# Patient Record
Sex: Male | Born: 1995 | Race: White | Hispanic: No | Marital: Single | Smoking: Never smoker
Health system: Southern US, Community
[De-identification: ages and names within clinical notes are randomized; demographics above are authoritative.]

## PROBLEM LIST (undated history)

## (undated) DIAGNOSIS — N049 Nephrotic syndrome with unspecified morphologic changes: Secondary | ICD-10-CM

---
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
## 2014-01-03 ENCOUNTER — Ambulatory Visit: Payer: Self-pay

## 2014-01-03 LAB — CBC WITH DIFFERENTIAL/PLATELET
Basophil #: 0.1 10*3/uL (ref 0.0–0.1)
Basophil %: 1.3 %
EOS PCT: 4.4 %
Eosinophil #: 0.3 10*3/uL (ref 0.0–0.7)
HCT: 44.1 % (ref 40.0–52.0)
HGB: 14.5 g/dL (ref 13.0–18.0)
Lymphocyte #: 1.1 10*3/uL (ref 1.0–3.6)
Lymphocyte %: 15.5 %
MCH: 31.7 pg (ref 26.0–34.0)
MCHC: 32.9 g/dL (ref 32.0–36.0)
MCV: 96 fL (ref 80–100)
MONOS PCT: 16.8 %
Monocyte #: 1.2 x10 3/mm — ABNORMAL HIGH (ref 0.2–1.0)
Neutrophil #: 4.5 10*3/uL (ref 1.4–6.5)
Neutrophil %: 62 %
Platelet: 359 10*3/uL (ref 150–440)
RBC: 4.59 10*6/uL (ref 4.40–5.90)
RDW: 14 % (ref 11.5–14.5)
WBC: 7.3 10*3/uL (ref 3.8–10.6)

## 2014-01-03 LAB — RENAL FUNCTION PANEL
ALBUMIN: 3.9 g/dL (ref 3.8–5.6)
Anion Gap: 7 (ref 7–16)
BUN: 6 mg/dL — AB (ref 9–21)
CO2: 28 mmol/L — AB (ref 16–25)
CREATININE: 0.83 mg/dL (ref 0.60–1.30)
Calcium, Total: 9.2 mg/dL (ref 9.0–10.7)
Chloride: 105 mmol/L (ref 97–107)
EGFR (African American): >60
Glucose: 95 mg/dL (ref 65–99)
OSMOLALITY: 277 (ref 275–301)
Phosphorus: 3.5 mg/dL (ref 3.1–5.1)
Potassium: 5 mmol/L — ABNORMAL HIGH (ref 3.3–4.7)
Sodium: 140 mmol/L (ref 132–141)

## 2014-01-03 LAB — PROTEIN / CREATININE RATIO, URINE: Creatinine, Urine: 106 mg/dL (ref 30.0–125.0)

## 2016-11-19 ENCOUNTER — Encounter: Payer: Self-pay | Admitting: Emergency Medicine

## 2016-11-19 ENCOUNTER — Emergency Department: Payer: BLUE CROSS/BLUE SHIELD

## 2016-11-19 ENCOUNTER — Emergency Department
Admission: EM | Admit: 2016-11-19 | Discharge: 2016-11-19 | Disposition: A | Discharge status: Home / Self Care | Payer: BLUE CROSS/BLUE SHIELD | Attending: Emergency Medicine | Admitting: Emergency Medicine

## 2016-11-19 DIAGNOSIS — Y929 Unspecified place or not applicable: Secondary | ICD-10-CM | POA: Insufficient documentation

## 2016-11-19 DIAGNOSIS — S0181XA Laceration without foreign body of other part of head, initial encounter: Secondary | ICD-10-CM

## 2016-11-19 DIAGNOSIS — Y939 Activity, unspecified: Secondary | ICD-10-CM | POA: Diagnosis not present

## 2016-11-19 DIAGNOSIS — W208XXA Other cause of strike by thrown, projected or falling object, initial encounter: Secondary | ICD-10-CM | POA: Diagnosis not present

## 2016-11-19 DIAGNOSIS — F17290 Nicotine dependence, other tobacco product, uncomplicated: Secondary | ICD-10-CM | POA: Diagnosis not present

## 2016-11-19 DIAGNOSIS — Y999 Unspecified external cause status: Secondary | ICD-10-CM | POA: Diagnosis not present

## 2016-11-19 DIAGNOSIS — S0990XA Unspecified injury of head, initial encounter: Secondary | ICD-10-CM | POA: Diagnosis present

## 2016-11-19 DIAGNOSIS — S01111A Laceration without foreign body of right eyelid and periocular area, initial encounter: Secondary | ICD-10-CM | POA: Diagnosis not present

## 2016-11-19 HISTORY — DX: Nephrotic syndrome with unspecified morphologic changes: N04.9

## 2016-11-19 MED ORDER — LIDOCAINE HCL (PF) 1 % IJ SOLN
INTRAMUSCULAR | Status: AC
  Administered 2016-11-19: 5 mL
  Filled 2016-11-19: qty 5
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
## 2016-11-19 NOTE — ED Triage Notes (Signed)
Pt ambulatory to triage in NAD, report hit head, laceration to left brow, edges not approximated, active bleeding at this time, wound dressed in triage.

## 2016-11-19 NOTE — ED Notes (Signed)
Pt. Verbalizes understanding of d/c instructions and follow-up. VS stable and pain controlled per pt.  Pt. In NAD at time of d/c and denies further concerns regarding this visit. Pt. Stable at the time of departure from the unit, departing unit by the safest and most appropriate manner per that pt condition and limitations. Pt advised to return to the ED at any time for emergent concerns, or for new/worsening symptoms.   

## 2016-11-19 NOTE — ED Notes (Signed)
Pt transported to CT at this time.

## 2017-04-25 ENCOUNTER — Other Ambulatory Visit: Payer: Self-pay | Admitting: Physician Assistant

## 2017-04-25 DIAGNOSIS — N63 Unspecified lump in unspecified breast: Secondary | ICD-10-CM

## 2017-05-02 ENCOUNTER — Other Ambulatory Visit: Payer: BLUE CROSS/BLUE SHIELD

## 2021-03-31 ENCOUNTER — Other Ambulatory Visit: Payer: Self-pay | Admitting: Nephrology

## 2021-03-31 DIAGNOSIS — R809 Proteinuria, unspecified: Secondary | ICD-10-CM

## 2021-03-31 DIAGNOSIS — R8290 Unspecified abnormal findings in urine: Secondary | ICD-10-CM

## 2021-03-31 DIAGNOSIS — I10 Essential (primary) hypertension: Secondary | ICD-10-CM

## 2021-04-12 ENCOUNTER — Other Ambulatory Visit: Payer: Self-pay | Admitting: Nephrology

## 2021-04-12 DIAGNOSIS — N049 Nephrotic syndrome with unspecified morphologic changes: Secondary | ICD-10-CM

## 2021-04-12 DIAGNOSIS — R768 Other specified abnormal immunological findings in serum: Secondary | ICD-10-CM

## 2021-04-16 ENCOUNTER — Other Ambulatory Visit: Payer: Self-pay | Admitting: Physician Assistant

## 2021-04-16 NOTE — Progress Notes (Signed)
Patient for Renal biopsy 04/19/2021, called and spoke with patient's mother with pre procedure instructions given. Made aware to be here @ 0830, NPO after Mn prior to procedure, and driver post procedure/recovery/discharge. Stated understanding.

## 2021-04-19 ENCOUNTER — Ambulatory Visit
Admission: RE | Admit: 2021-04-19 | Discharge: 2021-04-19 | Disposition: A | Discharge status: Home / Self Care | Payer: 59 | Source: Ambulatory Visit | Attending: Nephrology | Admitting: Nephrology

## 2021-04-19 DIAGNOSIS — I10 Essential (primary) hypertension: Secondary | ICD-10-CM | POA: Insufficient documentation

## 2021-04-19 DIAGNOSIS — N049 Nephrotic syndrome with unspecified morphologic changes: Secondary | ICD-10-CM | POA: Diagnosis present

## 2021-04-19 DIAGNOSIS — I129 Hypertensive chronic kidney disease with stage 1 through stage 4 chronic kidney disease, or unspecified chronic kidney disease: Secondary | ICD-10-CM | POA: Insufficient documentation

## 2021-04-19 DIAGNOSIS — R768 Other specified abnormal immunological findings in serum: Secondary | ICD-10-CM | POA: Insufficient documentation

## 2021-04-19 DIAGNOSIS — Z8616 Personal history of COVID-19: Secondary | ICD-10-CM | POA: Diagnosis not present

## 2021-04-19 DIAGNOSIS — N189 Chronic kidney disease, unspecified: Secondary | ICD-10-CM | POA: Diagnosis not present

## 2021-04-19 LAB — PROTIME-INR
INR: 1 (ref 0.8–1.2)
Prothrombin Time: 12.9 seconds (ref 11.4–15.2)

## 2021-04-19 LAB — CBC
HCT: 48.1 % (ref 39.0–52.0)
Hemoglobin: 16.4 g/dL (ref 13.0–17.0)
MCH: 32.1 pg (ref 26.0–34.0)
MCHC: 34.1 g/dL (ref 30.0–36.0)
MCV: 94.1 fL (ref 80.0–100.0)
Platelets: 341 10*3/uL (ref 150–400)
RBC: 5.11 MIL/uL (ref 4.22–5.81)
RDW: 12.3 % (ref 11.5–15.5)
WBC: 14 10*3/uL — ABNORMAL HIGH (ref 4.0–10.5)
nRBC: 0 % (ref 0.0–0.2)

## 2021-04-19 MED ORDER — FENTANYL CITRATE (PF) 100 MCG/2ML IJ SOLN
INTRAMUSCULAR | Status: DC | PRN
  Administered 2021-04-19 (×2): 50 ug via INTRAVENOUS

## 2021-04-19 MED ORDER — SODIUM CHLORIDE 0.9 % IV SOLN: INTRAVENOUS | Status: DC

## 2021-04-19 MED ORDER — FENTANYL CITRATE (PF) 100 MCG/2ML IJ SOLN
INTRAMUSCULAR | Status: AC
  Filled 2021-04-19: qty 4

## 2021-04-19 MED ORDER — LABETALOL HCL 5 MG/ML IV SOLN
INTRAVENOUS | Status: AC
  Filled 2021-04-19: qty 4

## 2021-04-19 MED ORDER — MIDAZOLAM HCL 5 MG/5ML IJ SOLN
INTRAMUSCULAR | Status: AC
  Filled 2021-04-19: qty 5

## 2021-04-19 MED ORDER — MIDAZOLAM HCL 2 MG/2ML IJ SOLN
INTRAMUSCULAR | Status: DC | PRN
  Administered 2021-04-19 (×2): 1 mg via INTRAVENOUS

## 2021-04-19 MED ORDER — HYDRALAZINE HCL 20 MG/ML IJ SOLN
INTRAMUSCULAR | Status: AC
  Filled 2021-04-19: qty 1

## 2021-04-19 NOTE — OR Nursing (Signed)
Alwyn Ren, NP assessed pt once more, okay to d/c.

## 2021-04-19 NOTE — Progress Notes (Signed)
Patient clinically stable post Renal biopsy per DR Miles Costain, tolerated well. Denies complaints post procedure. Report given to [facility] post procedure/specials.vitals remained stable pre and post procedure. Received Versed 2 mg along with Fentanyl 100 mcg IV for procedure.

## 2021-04-19 NOTE — Procedures (Signed)
Interventional Radiology Procedure Note  Procedure: US RENAL CORE BX    Complications: None  Estimated Blood Loss:  MIN  Findings: 8 G CORE X 2    M. Ruel Favors, MD

## 2021-04-19 NOTE — H&P (Signed)
Chief Complaint: History of nephrotic syndrome with new onset of edema and proteinuria. Team is requesting a random kidney biopsy.  Referring Physician(s): Kolluru,Sarath  Supervising Physician: Ruel Favors  Patient Status: [facility] - Out-pt  History of Present Illness: Larry Obrien is a 25 y.o. male outpatient. History of HTN, substance abuse, proteinuria and ANA positive. Patient previously had a kidney biopsy in 2008 (performed at [facility])) with no histological finding. It was determined that the patient had nephrotic syndrome and he was treated at that time with prednisone and cyclosporine. Was in remission for 6 years.  Approximately 6 weeks post COVID patient began have symptoms of nephrotic syndrome again with lower extremity edema and proteinuria. Patient has had minimal response to medicinal treatment Team is requesting a kidney biopsy for further evaluation of a relapse of nephrotic syndrome versus possible lupus nephritis.  Currently without any significant complaints.  Mother at bedside. Patient alert and laying in bed comfortable. Patient endorses anxiety. Denies any fevers, headache, chest pain, SOB, cough, abdominal pain, nausea, vomiting or bleeding. Return precautions and treatment recommendations and follow-up discussed with the patient who is agreeable with the plan.   Past Medical History:  Diagnosis Date   Nephrotic syndrome    remission    No past surgical history on file.  Allergies: Patient has no known allergies.  Medications: Prior to Admission medications   Not on File     No family history on file.  Social History   Socioeconomic History   Marital status: Single    Spouse name: Not on file   Number of children: Not on file   Years of education: Not on file   Highest education level: Not on file  Occupational History   Not on file  Tobacco Use   Smoking status: Never   Smokeless tobacco: Current  Substance and Sexual Activity   Alcohol  use: Yes   Drug use: Not on file   Sexual activity: Not on file  Other Topics Concern   Not on file  Social History Narrative   Not on file   Social Determinants of Health   Financial Resource Strain: Not on file  Food Insecurity: Not on file  Transportation Needs: Not on file  Physical Activity: Not on file  Stress: Not on file  Social Connections: Not on file    Review of Systems: A 12 point ROS discussed and pertinent positives are indicated in the HPI above.  All other systems are negative.  Review of Systems  Constitutional:  Negative for fever.  HENT:  Negative for congestion.   Respiratory:  Negative for cough and shortness of breath.   Cardiovascular:  Negative for chest pain.  Gastrointestinal:  Negative for abdominal pain.  Neurological:  Negative for headaches.  Psychiatric/Behavioral:  Negative for behavioral problems and confusion.    Vital Signs: BP (!) 145/86 (BP Location: Right Arm)   Pulse 94   Temp 98.4 F (36.9 C)   Resp 11   Ht 6' (1.829 m)   Wt 185 lb (83.9 kg)   SpO2 99%   BMI 25.09 kg/m   Physical Exam Vitals and nursing note reviewed.  Constitutional:      Appearance: He is well-developed.  HENT:     Head: Normocephalic.  Cardiovascular:     Rate and Rhythm: Normal rate and regular rhythm.     Heart sounds: Normal heart sounds.  Pulmonary:     Effort: Pulmonary effort is normal.     Breath  sounds: Normal breath sounds.  Musculoskeletal:        General: Normal range of motion.     Cervical back: Normal range of motion.  Skin:    General: Skin is dry.  Neurological:     Mental Status: He is alert and oriented to person, place, and time.    Imaging: No results found.  Labs:  CBC: Recent Labs    04/19/21 0903  WBC 14.0*  HGB 16.4  HCT 48.1  PLT 341    COAGS: No results for input(s): INR, APTT in the last 8760 hours.  BMP: No results for input(s): NA, K, CL, CO2, GLUCOSE, BUN, CALCIUM, CREATININE, GFRNONAA, GFRAA in  the last 8760 hours.  Invalid input(s): CMP   Assessment and Plan:  25 y.o. male outpatient. History of HTN, substance abuse, proteinuria and ANA positive. Patient previously had a kidney biopsy in 2008 (performed at [facility] with no histological finding. It was determined that the patient had nephrotic syndrome and he was treated at that time with prednisone and cyclosporine. Was in remission for 6 years.  Approximately 6 weeks post COVID patient began have symptoms of nephrotic syndrome again with lower extremity edema and proteinuria. Patient has had minimal response to medicinal treatment Team is requesting a kidney biopsy for further evaluation of a relapse of nephrotic syndrome versus possible lupus nephritis.  No pertinent imaging noted on Epic. Patient is on subaxone. Held since the evening on 7.17.22  All other labs and medications are within acceptable parameters. NKDA. Patient has been NPO since midnight.   Risks and benefits of kidney biopsy was discussed with the patient and/or patient's family including, but not limited to bleeding, infection, damage to adjacent structures or low yield requiring additional tests.  All of the questions were answered and there is agreement to proceed.  Consent signed and in chart.   Thank you for this interesting consult.  I greatly enjoyed meeting BRENTYN SEEHAFER and look forward to participating in their care.  A copy of this report was sent to the requesting provider on this date.  Electronically Signed: Alene Mires, NP 04/19/2021, 9:37 AM   I spent a total of  30 Minutes   in face to face in clinical consultation, greater than 50% of which was counseling/coordinating care for kidney biopsy

## 2021-04-19 NOTE — OR Nursing (Signed)
PA evaluated pt biopsy site. No new orders.

## 2021-04-26 ENCOUNTER — Ambulatory Visit: Payer: 59

## 2021-06-08 ENCOUNTER — Encounter: Payer: Self-pay | Admitting: Nephrology

## 2021-06-08 LAB — SURGICAL PATHOLOGY

## 2021-09-07 ENCOUNTER — Other Ambulatory Visit: Payer: Self-pay

## 2021-09-07 ENCOUNTER — Emergency Department: Payer: 59

## 2021-09-07 ENCOUNTER — Inpatient Hospital Stay
Admission: EM | Admit: 2021-09-07 | Discharge: 2021-09-10 | DRG: 699 | Disposition: A | Discharge status: Home / Self Care | Payer: 59 | Attending: Internal Medicine | Admitting: Internal Medicine

## 2021-09-07 DIAGNOSIS — I10 Essential (primary) hypertension: Secondary | ICD-10-CM | POA: Diagnosis present

## 2021-09-07 DIAGNOSIS — M7989 Other specified soft tissue disorders: Secondary | ICD-10-CM

## 2021-09-07 DIAGNOSIS — Z20822 Contact with and (suspected) exposure to covid-19: Secondary | ICD-10-CM | POA: Diagnosis present

## 2021-09-07 DIAGNOSIS — Z79899 Other long term (current) drug therapy: Secondary | ICD-10-CM | POA: Diagnosis not present

## 2021-09-07 DIAGNOSIS — E8809 Other disorders of plasma-protein metabolism, not elsewhere classified: Secondary | ICD-10-CM | POA: Diagnosis present

## 2021-09-07 DIAGNOSIS — R809 Proteinuria, unspecified: Secondary | ICD-10-CM

## 2021-09-07 DIAGNOSIS — N289 Disorder of kidney and ureter, unspecified: Secondary | ICD-10-CM

## 2021-09-07 DIAGNOSIS — N028 Recurrent and persistent hematuria with other morphologic changes: Secondary | ICD-10-CM | POA: Diagnosis present

## 2021-09-07 DIAGNOSIS — E871 Hypo-osmolality and hyponatremia: Secondary | ICD-10-CM | POA: Diagnosis present

## 2021-09-07 DIAGNOSIS — N049 Nephrotic syndrome with unspecified morphologic changes: Principal | ICD-10-CM

## 2021-09-07 DIAGNOSIS — R9431 Abnormal electrocardiogram [ECG] [EKG]: Secondary | ICD-10-CM | POA: Diagnosis not present

## 2021-09-07 DIAGNOSIS — Z720 Tobacco use: Secondary | ICD-10-CM

## 2021-09-07 LAB — BRAIN NATRIURETIC PEPTIDE: B Natriuretic Peptide: 16.4 pg/mL (ref 0.0–100.0)

## 2021-09-07 LAB — URINALYSIS, ROUTINE W REFLEX MICROSCOPIC
Bacteria, UA: NONE SEEN
Bilirubin Urine: NEGATIVE
Glucose, UA: NEGATIVE mg/dL
Ketones, ur: NEGATIVE mg/dL
Leukocytes,Ua: NEGATIVE
Nitrite: NEGATIVE
Protein, ur: >=300 mg/dL — AB
Specific Gravity, Urine: 1.017 (ref 1.005–1.030)
pH: 6 (ref 5.0–8.0)

## 2021-09-07 LAB — CBC WITH DIFFERENTIAL/PLATELET
Abs Immature Granulocytes: 0.09 10*3/uL — ABNORMAL HIGH (ref 0.00–0.07)
Basophils Absolute: 0.2 10*3/uL — ABNORMAL HIGH (ref 0.0–0.1)
Basophils Relative: 2 %
Eosinophils Absolute: 0.4 10*3/uL (ref 0.0–0.5)
Eosinophils Relative: 4 %
HCT: 52.2 % — ABNORMAL HIGH (ref 39.0–52.0)
Hemoglobin: 17.4 g/dL — ABNORMAL HIGH (ref 13.0–17.0)
Immature Granulocytes: 1 %
Lymphocytes Relative: 43 %
Lymphs Abs: 4.1 10*3/uL — ABNORMAL HIGH (ref 0.7–4.0)
MCH: 30.5 pg (ref 26.0–34.0)
MCHC: 33.3 g/dL (ref 30.0–36.0)
MCV: 91.6 fL (ref 80.0–100.0)
Monocytes Absolute: 0.8 10*3/uL (ref 0.1–1.0)
Monocytes Relative: 9 %
Neutro Abs: 3.8 10*3/uL (ref 1.7–7.7)
Neutrophils Relative %: 41 %
Platelets: 618 10*3/uL — ABNORMAL HIGH (ref 150–400)
RBC: 5.7 MIL/uL (ref 4.22–5.81)
RDW: 12.3 % (ref 11.5–15.5)
WBC: 9.3 10*3/uL (ref 4.0–10.5)
nRBC: 0 % (ref 0.0–0.2)

## 2021-09-07 LAB — TROPONIN I (HIGH SENSITIVITY): Troponin I (High Sensitivity): 9 ng/L (ref <18)

## 2021-09-07 LAB — COMPREHENSIVE METABOLIC PANEL
ALT: 15 U/L (ref 0–44)
AST: 18 U/L (ref 15–41)
Albumin: <1.5 g/dL — ABNORMAL LOW (ref 3.5–5.0)
Alkaline Phosphatase: 77 U/L (ref 38–126)
Anion gap: 5 (ref 5–15)
BUN: 15 mg/dL (ref 6–20)
CO2: 31 mmol/L (ref 22–32)
Calcium: 7.9 mg/dL — ABNORMAL LOW (ref 8.9–10.3)
Chloride: 92 mmol/L — ABNORMAL LOW (ref 98–111)
Creatinine, Ser: 0.8 mg/dL (ref 0.61–1.24)
GFR, Estimated: >60 mL/min (ref >60)
Glucose, Bld: 95 mg/dL (ref 70–99)
Potassium: 3.9 mmol/L (ref 3.5–5.1)
Sodium: 128 mmol/L — ABNORMAL LOW (ref 135–145)
Total Bilirubin: 0.6 mg/dL (ref 0.3–1.2)
Total Protein: 4.5 g/dL — ABNORMAL LOW (ref 6.5–8.1)

## 2021-09-07 LAB — RESP PANEL BY RT-PCR (FLU A&B, COVID) ARPGX2
Influenza A by PCR: NEGATIVE
Influenza B by PCR: NEGATIVE
SARS Coronavirus 2 by RT PCR: NEGATIVE

## 2021-09-07 MED ORDER — SPIRONOLACTONE 25 MG PO TABS
25.0000 mg | ORAL_TABLET | Freq: Every day | ORAL | Status: DC
  Administered 2021-09-07 – 2021-09-10 (×4): 25 mg via ORAL
  Filled 2021-09-07 (×4): qty 1

## 2021-09-07 MED ORDER — FUROSEMIDE 10 MG/ML IJ SOLN
80.0000 mg | Freq: Once | INTRAMUSCULAR | Status: AC
  Administered 2021-09-07: 80 mg via INTRAVENOUS
  Filled 2021-09-07: qty 8

## 2021-09-07 MED ORDER — LOSARTAN POTASSIUM 25 MG PO TABS
25.0000 mg | ORAL_TABLET | Freq: Every day | ORAL | Status: DC
  Administered 2021-09-07 – 2021-09-10 (×4): 25 mg via ORAL
  Filled 2021-09-07 (×4): qty 1

## 2021-09-07 MED ORDER — ALBUMIN HUMAN 25 % IV SOLN
25.0000 g | Freq: Three times a day (TID) | INTRAVENOUS | Status: DC
  Administered 2021-09-07 – 2021-09-10 (×8): 25 g via INTRAVENOUS
  Filled 2021-09-07 (×10): qty 100

## 2021-09-07 MED ORDER — ALBUTEROL SULFATE (2.5 MG/3ML) 0.083% IN NEBU
2.5000 mg | INHALATION_SOLUTION | Freq: Four times a day (QID) | RESPIRATORY_TRACT | Status: DC
  Filled 2021-09-07 (×2): qty 3

## 2021-09-07 MED ORDER — HYDRALAZINE HCL 20 MG/ML IJ SOLN: 10.0000 mg | Freq: Four times a day (QID) | INTRAMUSCULAR | Status: DC | PRN

## 2021-09-07 MED ORDER — METHYLPREDNISOLONE SODIUM SUCC 125 MG IJ SOLR
125.0000 mg | INTRAMUSCULAR | Status: AC
  Administered 2021-09-07 – 2021-09-09 (×3): 125 mg via INTRAVENOUS
  Filled 2021-09-07 (×3): qty 2

## 2021-09-07 MED ORDER — ZOLPIDEM TARTRATE 5 MG PO TABS: 5.0000 mg | ORAL_TABLET | Freq: Every evening | ORAL | Status: DC | PRN

## 2021-09-07 MED ORDER — ACETAMINOPHEN 325 MG PO TABS: 650.0000 mg | ORAL_TABLET | Freq: Four times a day (QID) | ORAL | Status: DC | PRN

## 2021-09-07 MED ORDER — ACETAMINOPHEN 650 MG RE SUPP
650.0000 mg | Freq: Four times a day (QID) | RECTAL | Status: DC | PRN
  Filled 2021-09-07: qty 1

## 2021-09-07 MED ORDER — ENOXAPARIN SODIUM 40 MG/0.4ML IJ SOSY
40.0000 mg | PREFILLED_SYRINGE | INTRAMUSCULAR | Status: DC
  Filled 2021-09-07 (×3): qty 0.4

## 2021-09-07 NOTE — ED Triage Notes (Addendum)
Pt presents to ED with c/o of "swelling" and states Dr. Orlena Sheldon wanted him to come to ED for further eval of swelling.   Pt states 36 lb weight gain in 2 weeks. Pt states HX of nephrotic syndrome as a child. Pt is A&Ox4. Pt denies SOB.   Pt does have swelling all over body at this time.

## 2021-09-07 NOTE — ED Notes (Signed)
US at bedside

## 2021-09-07 NOTE — H&P (Signed)
[facility] History and Physical  Larry Obrien DOB: January 07, 1996 DOA: 09/07/2021 PCP: Rusty Aus, MD  Admitted from: Home Chief Complaint: Progressively worsening generalized edema  History of Present Illness: Larry Obrien is a 25 y.o. male with history of nephrotic syndrome currently on steroids, follows up with nephrologist Dr. Juleen China as an outpatient.  Patient reports that he has gained about 36 pounds in the last 2 weeks.  He was tried on Lasix 60 mg daily as well as Aldactone without success as an outpatient.  He is also on losartan.  He continues to have generalized swelling of his body.  Weight at baseline about 180 pounds while he states he weighs around 220 pounds now.  Reportedly checked his urine at home and it had 2+ protein. 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  In the ED, temperature 98.2, heart rate 98, respiratory rate 19, blood pressure 146/100, oxygen saturation 98% on room air Labs with sodium low at 128, potassium 3.9, BUN 15, creatinine 0.8, albumin less than 1.5, WBC 9.3, hemoglobin 17.4, platelets 618. Urinalysis with hazy yellow urine with small amount of hemoglobin Chest x-ray with mild bilateral atelectasis and bilateral small effusion.  At the time of my evaluation, patient was lying on bed.  Not in distress.  No respiratory symptoms. Family at bedside. Seen by nephrology Dr. Juleen China in the ED.    Review of Systems:  All systems were reviewed and were negative unless otherwise mentioned in the HPI   Past medical history: Past Medical History:  Diagnosis Date   Nephrotic syndrome    remission    Past surgical history: No past surgical history on file.  Social History:  reports that he has never smoked. He uses smokeless tobacco. He reports current alcohol use. No history on file for drug use.  Allergies:  No Known Allergies  Family history:  No family history on  file.  Family history reviewed with patient.  Unremarkable to current presentation  Home Meds: Prior to Admission medications   Medication Sig Start Date End Date Taking? Authorizing Provider  buprenorphine-naloxone (SUBOXONE) 8-2 mg SUBL SL tablet Place 1 tablet under the tongue daily.    [provider]  furosemide (LASIX) 40 MG tablet Take 40 mg by mouth. Daily as needed    [provider]    Physical Exam: Vitals:   09/07/21 1440 09/07/21 1500 09/07/21 1530 09/07/21 1600  BP: 133/87 130/74 (!) 143/80 (!) 142/95  Pulse: 83 85 78 65  Resp: 14 13 19 13   Temp:      TempSrc:      SpO2: 97% 98% 99% 100%   Wt Readings from Last 3 Encounters:  04/19/21 83.9 kg  11/19/16 74.8 kg   There is no height or weight on file to calculate BMI.  General exam: Pleasant, young Caucasian male.  Not in physical distress Skin: No rashes, lesions or ulcers. HEENT: Atraumatic, normocephalic, no obvious bleeding Lungs: Clear to auscultation bilaterally CVS: Regular rate and rhythm, no murmur GI/Abd soft, nontender, nondistended, bowel sound present CNS: Alert, awake, oriented x3 Psychiatry: Mood appropriate Extremities: 1+ bilateral pedal edema, no calf tenderness     Consult Orders  (From admission, onward)           Start     Ordered   09/07/21 1605  Consult to hospitalist  Once       Provider:  (Not yet assigned)  Question Answer Comment  Place call to: [phone]   Reason for Consult Admit      09/07/21 1604            Labs on Admission:   CBC: Recent Labs  Lab 09/07/21 1418  WBC 9.3  NEUTROABS 3.8  HGB 17.4*  HCT 52.2*  MCV 91.6  PLT 618*    Basic Metabolic Panel: Recent Labs  Lab 09/07/21 1418  NA 128*  K 3.9  CL 92*  CO2 31  GLUCOSE 95  BUN 15  CREATININE 0.80  CALCIUM 7.9*    Liver Function Tests: Recent Labs  Lab 09/07/21 1418  AST 18  ALT 15  ALKPHOS 77  BILITOT 0.6  PROT 4.5*  ALBUMIN <1.5*   No results for input(s):  LIPASE, AMYLASE in the last 168 hours. No results for input(s): AMMONIA in the last 168 hours.  Cardiac Enzymes: No results for input(s): CKTOTAL, CKMB, CKMBINDEX, TROPONINI in the last 168 hours.  BNP (last 3 results) Recent Labs    09/07/21 1418  BNP 16.4    ProBNP (last 3 results) No results for input(s): PROBNP in the last 8760 hours.  CBG: No results for input(s): GLUCAP in the last 168 hours.  Lipase  No results found for: LIPASE   Urinalysis    Component Value Date/Time   COLORURINE YELLOW (A) 09/07/2021 1543   APPEARANCEUR HAZY (A) 09/07/2021 1543   LABSPEC 1.017 09/07/2021 1543   PHURINE 6.0 09/07/2021 1543   GLUCOSEU NEGATIVE 09/07/2021 1543   HGBUR SMALL (A) 09/07/2021 1543   BILIRUBINUR NEGATIVE 09/07/2021 1543   KETONESUR NEGATIVE 09/07/2021 1543   PROTEINUR >=300 (A) 09/07/2021 1543   NITRITE NEGATIVE 09/07/2021 1543   LEUKOCYTESUR NEGATIVE 09/07/2021 1543     Drugs of Abuse  No results found for: LABOPIA, COCAINSCRNUR, LABBENZ, AMPHETMU, THCU, LABBARB    Radiological Exams on Admission: US Venous Img Lower Bilateral  Result Date: 09/07/2021 CLINICAL DATA:  Bilateral lower extremity swelling EXAM: Bilateral LOWER EXTREMITY VENOUS DOPPLER ULTRASOUND TECHNIQUE: Gray-scale sonography with compression, as well as color and duplex ultrasound, were performed to evaluate the deep venous system(s) from the level of the common femoral vein through the popliteal and proximal calf veins. COMPARISON:  None. FINDINGS: VENOUS Normal compressibility of the common femoral, superficial femoral, and popliteal veins, as well as the visualized calf veins. Visualized portions of profunda femoral vein and great saphenous vein unremarkable. No filling defects to suggest DVT on grayscale or color Doppler imaging. Doppler waveforms show normal direction of venous flow, normal respiratory plasticity and response to augmentation. Limited views of the contralateral common femoral vein  are unremarkable. OTHER Fluid collections in the popliteal fossa is, on the right measuring 5.5 x 0.7 x 2 cm and on the left measuring 3.5 x 0.9 x 1.9 cm. Limitations: none IMPRESSION: 1. Negative for acute bilateral lower extremity DVT. 2. Bilateral popliteal fossa cysts. Electronically Signed   By: Jasmine Pang M.D.   On: 09/07/2021 16:44   DG Chest Portable 1 View  Result Date: 09/07/2021 CLINICAL DATA:  Shortness of breath. EXAM: PORTABLE CHEST 1 VIEW COMPARISON:  None. FINDINGS: The heart size and mediastinal contours are within normal limits. Mild bibasilar subsegmental atelectasis is noted with small bilateral pleural effusions. The visualized skeletal structures are unremarkable. IMPRESSION: Mild bibasilar subsegmental atelectasis with small bilateral pleural effusions. Electronically Signed   By: Lupita Raider M.D.   On: 09/07/2021 15:51     ------------------------------------------------------------------------------------------------------ Assessment/Plan: Principal Problem:   Nephrotic syndrome  Acute flareup of nephrotic syndrome -Follows up with Dr. Juleen China as an outpatient.   -Home meds include prednisone, Lasix, Aldactone, losartan. -Given IV Lasix in the ED.  Started on IV Solu-Medrol. -Defer to nephrology for further management.  Hypoalbuminemia -Albumin level lower than 1.5, likely due to proteinuria as part of nephrotic syndrome itself. -Defer to nephrology for any indication of albumin infusion      Mobility: Encourage ambulation Code Status:   Code Status: Full Code  DVT prophylaxis: Lovenox subcu Antimicrobials: None Fluid: None  Diet:  Diet Order             Diet 2 gram sodium Room service appropriate? Yes; Fluid consistency: Thin  Diet effective now                    Consultants: Nephrology Family Communication: Family at bedside    Dispo: The patient is from: Home              Anticipated d/c is to: Home              Anticipated d/c  date is: > 3 days  ------------------------------------------------------------------------------------- Severity of Illness: The appropriate patient status for this patient is INPATIENT. Inpatient status is judged to be reasonable and necessary in order to provide the required intensity of service to ensure the patient's safety. The patient's presenting symptoms, physical exam findings, and initial radiographic and laboratory data in the context of their chronic comorbidities is felt to place them at high risk for further clinical deterioration. Furthermore, it is not anticipated that the patient will be medically stable for discharge from the hospital within 2 midnights of admission.   * I certify that at the point of admission it is my clinical judgment that the patient will require inpatient hospital care spanning beyond 2 midnights from the point of admission due to high intensity of service, high risk for further deterioration and high frequency of surveillance required.*   Signed, Terrilee Croak, MD [facility] 09/07/2021

## 2021-09-07 NOTE — Progress Notes (Signed)
[facility]  ROUNDING NOTE   Subjective:   Patient was seen by me in the office on 11/16 where it was noted that he was having more peripheral edema and home urine dipsticks with proteinuria. Found to have 3.4 grams of proteinuria. He was then asked to restart high dose prednisone at 60mg  PO daily. He was to continue his losartan and furosemide.  Patient states he has been going up on his furosemide dose. Currently at 60mg  daily. Also started on spironolactone, fluid restriction and salt restriction. However patient is now greater than 30 pounds above his original weight.   Patient asked to present to the Emergency Department after failing outpatient diuretic therapy.     Objective:  Vital signs in last 24 hours:  Temp:  [98.2 F (36.8 C)] 98.2 F (36.8 C) (12/06 1413) Pulse Rate:  [65-98] 83 (12/06 1700) Resp:  [13-19] 18 (12/06 1700) BP: (129-146)/(74-100) 129/75 (12/06 1700) SpO2:  [97 %-100 %] 97 % (12/06 1700) Weight:  [86.2 kg] 86.2 kg (12/06 1700)  Weight change:  Filed Weights   09/07/21 1700  Weight: 86.2 kg    Intake/Output: No intake/output data recorded.   Intake/Output this shift:  Total I/O In: -  Out: 750 [Urine:750]  Physical Exam: General: NAD, laying in bed  Head: Normocephalic, atraumatic. Moist oral mucosal membranes  Eyes: Anicteric, PERRL  Neck: Supple, trachea midline  Lungs:  Clear to auscultation  Heart: Regular rate and rhythm  Abdomen:  Soft, nontender,   Extremities:  + peripheral edema.  Neurologic: Nonfocal, moving all four extremities  Skin: No lesions       Basic Metabolic Panel: Recent Labs  Lab 09/07/21 1418  NA 128*  K 3.9  CL 92*  CO2 31  GLUCOSE 95  BUN 15  CREATININE 0.80  CALCIUM 7.9*    Liver Function Tests: Recent Labs  Lab 09/07/21 1418  AST 18  ALT 15  ALKPHOS 77  BILITOT 0.6  PROT 4.5*  ALBUMIN <1.5*   No results for input(s): LIPASE, AMYLASE in the last 168 hours. No results for  input(s): AMMONIA in the last 168 hours.  CBC: Recent Labs  Lab 09/07/21 1418  WBC 9.3  NEUTROABS 3.8  HGB 17.4*  HCT 52.2*  MCV 91.6  PLT 618*    Cardiac Enzymes: No results for input(s): CKTOTAL, CKMB, CKMBINDEX, TROPONINI in the last 168 hours.  BNP: Invalid input(s): POCBNP  CBG: No results for input(s): GLUCAP in the last 168 hours.  Microbiology: Results for orders placed or performed during the hospital encounter of 09/07/21  Resp Panel by RT-PCR (Flu A&B, Covid) Nasopharyngeal Swab     Status: None   Collection Time: 09/07/21  3:43 PM   Specimen: Nasopharyngeal Swab; Nasopharyngeal(NP) swabs in vial transport medium  Result Value Ref Range Status   SARS Coronavirus 2 by RT PCR NEGATIVE NEGATIVE Final    Comment: (NOTE) SARS-CoV-2 target nucleic acids are NOT DETECTED.  The SARS-CoV-2 RNA is generally detectable in upper respiratory specimens during the acute phase of infection. The lowest concentration of SARS-CoV-2 viral copies this assay can detect is 138 copies/mL. A negative result does not preclude SARS-Cov-2 infection and should not be used as the sole basis for treatment or other patient management decisions. A negative result may occur with  improper specimen collection/handling, submission of specimen other than nasopharyngeal swab, presence of viral mutation(s) within the areas targeted by this assay, and inadequate number of viral copies(<138 copies/mL). A negative result must  be combined with clinical observations, patient history, and epidemiological information. The expected result is Negative.  Fact Sheet for Patients:  BloggerCourse.com  Fact Sheet for Healthcare Providers:  SeriousBroker.it  This test is no t yet approved or cleared by the [place] FDA and  has been authorized for detection and/or diagnosis of SARS-CoV-2 by FDA under an Emergency Use Authorization (EUA). This EUA will  remain  in effect (meaning this test can be used) for the duration of the COVID-19 declaration under Section 564(b)(1) of the Act, 21 U.S.C.section 360bbb-3(b)(1), unless the authorization is terminated  or revoked sooner.       Influenza A by PCR NEGATIVE NEGATIVE Final   Influenza B by PCR NEGATIVE NEGATIVE Final    Comment: (NOTE) The Xpert Xpress SARS-CoV-2/FLU/RSV plus assay is intended as an aid in the diagnosis of influenza from Nasopharyngeal swab specimens and should not be used as a sole basis for treatment. Nasal washings and aspirates are unacceptable for Xpert Xpress SARS-CoV-2/FLU/RSV testing.  Fact Sheet for Patients: BloggerCourse.com  Fact Sheet for Healthcare Providers: SeriousBroker.it  This test is not yet approved or cleared by the [place] FDA and has been authorized for detection and/or diagnosis of SARS-CoV-2 by FDA under an Emergency Use Authorization (EUA). This EUA will remain in effect (meaning this test can be used) for the duration of the COVID-19 declaration under Section 564(b)(1) of the Act, 21 U.S.C. section 360bbb-3(b)(1), unless the authorization is terminated or revoked.  Performed at [facility], [address] [place], [place] [address]     Coagulation Studies: No results for input(s): LABPROT, INR in the last 72 hours.  Urinalysis: Recent Labs    09/07/21 1543  COLORURINE YELLOW*  LABSPEC 1.017  PHURINE 6.0  GLUCOSEU NEGATIVE  HGBUR SMALL*  BILIRUBINUR NEGATIVE  KETONESUR NEGATIVE  PROTEINUR >=300*  NITRITE NEGATIVE  LEUKOCYTESUR NEGATIVE      Imaging: US Venous Img Lower Bilateral  Result Date: 09/07/2021 CLINICAL DATA:  Bilateral lower extremity swelling EXAM: Bilateral LOWER EXTREMITY VENOUS DOPPLER ULTRASOUND TECHNIQUE: Gray-scale sonography with compression, as well as color and duplex ultrasound, were performed to evaluate the deep venous system(s)  from the level of the common femoral vein through the popliteal and proximal calf veins. COMPARISON:  None. FINDINGS: VENOUS Normal compressibility of the common femoral, superficial femoral, and popliteal veins, as well as the visualized calf veins. Visualized portions of profunda femoral vein and great saphenous vein unremarkable. No filling defects to suggest DVT on grayscale or color Doppler imaging. Doppler waveforms show normal direction of venous flow, normal respiratory plasticity and response to augmentation. Limited views of the contralateral common femoral vein are unremarkable. OTHER Fluid collections in the popliteal fossa is, on the right measuring 5.5 x 0.7 x 2 cm and on the left measuring 3.5 x 0.9 x 1.9 cm. Limitations: none IMPRESSION: 1. Negative for acute bilateral lower extremity DVT. 2. Bilateral popliteal fossa cysts. Electronically Signed   By: Jasmine Pang M.D.   On: 09/07/2021 16:44   DG Chest Portable 1 View  Result Date: 09/07/2021 CLINICAL DATA:  Shortness of breath. EXAM: PORTABLE CHEST 1 VIEW COMPARISON:  None. FINDINGS: The heart size and mediastinal contours are within normal limits. Mild bibasilar subsegmental atelectasis is noted with small bilateral pleural effusions. The visualized skeletal structures are unremarkable. IMPRESSION: Mild bibasilar subsegmental atelectasis with small bilateral pleural effusions. Electronically Signed   By: Lupita Raider M.D.   On: 09/07/2021 15:51     Medications:  albuterol  2.5 mg Nebulization Q6H   enoxaparin (LOVENOX) injection  40 mg Subcutaneous Q24H   acetaminophen **OR** acetaminophen, hydrALAZINE, zolpidem  Assessment/ Plan:  Mr. Larry Obrien is a 25 y.o. 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 Seems to have initially achieved remission with high dose steroids. However back with relapse despite high dose steroids, losartan, and loop diuretics.   IgA nephropathy with nephrotic  syndrome Proteinuria  Peripheral edema/anasarca Hypertension Hypoalbuminuria.   Failed outpatient therapy with PO furosemide, spironolactone, losartan and PO prednisone. Bilateral lower extremity dopplers negative for DVT.   - IV furosemide 80mg  q12 - IV albumin  - Start IV methylprednisolone 120mg  x3 days.  - Continue losartan and spironolactone - fluid restriction  - Consider echocardiogram     LOS: 0 Julea Hutto 12/6/20225:32 PM

## 2021-09-07 NOTE — ED Provider Notes (Signed)
[facility] Emergency Department Provider Note  ____________________________________________   Event Date/Time   First MD Initiated Contact with Patient 09/07/21 1508     (approximate)  I have reviewed the triage vital signs and the nursing notes.   HISTORY  Chief Complaint Leg Swelling    HPI Larry Obrien is a 25 y.o. male with nephrotic syndrome who comes in per Dr. Orlena Sheldon for ed of swelling.  Patient reports that he has gained 36 pounds the past 2 weeks.  He is already on Lasix 60 mg and recently started on spironolactone a few days ago.  He is continue to have swelling in his legs, arms, abdomen.  Reports a tiny bit of shortness of breath but states that he just feels like is from the distention.  His swelling is constant, not better with any medications, nothing makes it worse.  He reports a history of nephrotic syndrome status post biopsy consistent with IgA nephropathy.  He is currently on oral steroids.  He reports that he is tested his urine at home and he still has 2+ protein in.  He is also already on ARB          Past Medical History:  Diagnosis Date   Nephrotic syndrome    remission    There are no problems to display for this patient.   No past surgical history on file.  Prior to Admission medications   Medication Sig Start Date End Date Taking? Authorizing Provider  buprenorphine-naloxone (SUBOXONE) 8-2 mg SUBL SL tablet Place 1 tablet under the tongue daily.    [provider]  furosemide (LASIX) 40 MG tablet Take 40 mg by mouth. Daily as needed    [provider]    Allergies Patient has no known allergies.  No family history on file.  Social History Social History   Tobacco Use   Smoking status: Never   Smokeless tobacco: Current  Substance Use Topics   Alcohol use: Yes      Review of Systems Constitutional: No fever/chills Eyes: No visual changes. ENT: No sore throat. Cardiovascular:  Denies chest pain. Respiratory: Mild shortness of breath Gastrointestinal: Abdominal swelling  No nausea, no vomiting.  No diarrhea.  No constipation. Genitourinary: Negative for dysuria. Musculoskeletal: Leg swelling.  Arm swelling Skin: Negative for rash. Neurological: Negative for headaches, focal weakness or numbness. All other ROS negative ____________________________________________   PHYSICAL EXAM:  VITAL SIGNS: ED Triage Vitals  Enc Vitals Group     BP 09/07/21 1413 (!) 146/100     Pulse Rate 09/07/21 1413 98     Resp 09/07/21 1413 19     Temp 09/07/21 1413 98.2 F (36.8 C)     Temp Source 09/07/21 1413 Oral     SpO2 09/07/21 1413 98 %     Weight --      Height --      Head Circumference --      Peak Flow --      Pain Score 09/07/21 1415 0     Pain Loc --      Pain Edu? --      Excl. in GC? --     Constitutional: Alert and oriented. Well appearing and in no acute distress. Eyes: Conjunctivae are normal. EOMI. Head: Atraumatic. Nose: No congestion/rhinnorhea. Mouth/Throat: Mucous membranes are moist.   Neck: No stridor. Trachea Midline. FROM Cardiovascular: Normal rate, regular rhythm. Grossly normal heart sounds.  Good peripheral circulation. Respiratory: Normal respiratory effort.  No  retractions. Lungs CTAB. Gastrointestinal: Soft and nontender. No distention. No abdominal bruits.  Musculoskeletal: Edema noted in the lower extremities.  Mild also in the hands. Neurologic:  Normal speech and language. No gross focal neurologic deficits are appreciated.  Skin:  Skin is warm, dry and intact. No rash noted. Psychiatric: Mood and affect are normal. Speech and behavior are normal. GU: Deferred   ____________________________________________   LABS (all labs ordered are listed, but only abnormal results are displayed)  Labs Reviewed  COMPREHENSIVE METABOLIC PANEL - Abnormal; Notable for the following components:      Result Value   Sodium 128 (*)    Chloride  92 (*)    Calcium 7.9 (*)    Total Protein 4.5 (*)    Albumin <1.5 (*)    All other components within normal limits  CBC WITH DIFFERENTIAL/PLATELET - Abnormal; Notable for the following components:   Hemoglobin 17.4 (*)    HCT 52.2 (*)    Platelets 618 (*)    Lymphs Abs 4.1 (*)    Basophils Absolute 0.2 (*)    Abs Immature Granulocytes 0.09 (*)    All other components within normal limits  BRAIN NATRIURETIC PEPTIDE  URINALYSIS, ROUTINE W REFLEX MICROSCOPIC   ____________________________________________ RADIOLOGY Vela Prose, personally viewed and evaluated these images (plain radiographs) as part of my medical decision making, as well as reviewing the written report by the radiologist.  ED MD interpretation: Mild bilateral pleural effusion  Official radiology report(s): DG Chest Portable 1 View  Result Date: 09/07/2021 CLINICAL DATA:  Shortness of breath. EXAM: PORTABLE CHEST 1 VIEW COMPARISON:  None. FINDINGS: The heart size and mediastinal contours are within normal limits. Mild bibasilar subsegmental atelectasis is noted with small bilateral pleural effusions. The visualized skeletal structures are unremarkable. IMPRESSION: Mild bibasilar subsegmental atelectasis with small bilateral pleural effusions. Electronically Signed   By: Lupita Raider M.D.   On: 09/07/2021 15:51    ____________________________________________   PROCEDURES  Procedure(s) performed (including Critical Care):  Procedures   ____________________________________________   INITIAL IMPRESSION / ASSESSMENT AND PLAN / ED COURSE  Larry Obrien was evaluated in Emergency Department on 09/07/2021 for the symptoms described in the history of present illness. He was evaluated in the context of the global COVID-19 pandemic, which necessitated consideration that the patient might be at risk for infection with the SARS-CoV-2 virus that causes COVID-19. Institutional protocols and algorithms that pertain to  the evaluation of patients at risk for COVID-19 are in a state of rapid change based on information released by regulatory bodies including the CDC and federal and state organizations. These policies and algorithms were followed during the patient's care in the ED.    Patient is a 25 year old with history of nephrotic syndrome who comes in with concerns for swelling not better with oral Lasix, spironolactone.  This is concerning for oral therapy failure.  Discussion with Dr. Wynelle Link from nephrology recommends admission for IV Lasix.  We will give a dose of 80 of IV Lasix.  He still waiting for the urine to decide if he needs any IV steroids.  We will get chest x-ray to evaluate for pulmonary edema.  We will get ultrasounds of his legs to make sure no evidence of concurrent DVT.  Will discuss to the hospital team for admission  Labs concerning for some hyponatremia most likely secondary to the fluid overload  EKG my interpretation is sinus with T wave versions in the inferior lateral leads, normal intervals.  Will add on trop given SOB most most likely from fluid overload.   Discussed the hospital team for admission.     ____________________________________________   FINAL CLINICAL IMPRESSION(S) / ED DIAGNOSES   Final diagnoses:  Nephropathy  Leg swelling  Hyponatremia      MEDICATIONS GIVEN DURING THIS VISIT:  Medications  furosemide (LASIX) injection 80 mg (80 mg Intravenous Given 09/07/21 1544)     ED Discharge Orders     None        Note:  This document was prepared using Dragon voice recognition software and may include unintentional dictation errors.    Concha Se, MD 09/07/21 [phone]

## 2021-09-08 ENCOUNTER — Encounter: Payer: Self-pay | Admitting: Internal Medicine

## 2021-09-08 ENCOUNTER — Inpatient Hospital Stay (HOSPITAL_COMMUNITY)
Admit: 2021-09-08 | Discharge: 2021-09-08 | Disposition: A | Discharge status: Home / Self Care | Payer: 59 | Attending: Internal Medicine | Admitting: Internal Medicine

## 2021-09-08 DIAGNOSIS — R9431 Abnormal electrocardiogram [ECG] [EKG]: Secondary | ICD-10-CM | POA: Diagnosis not present

## 2021-09-08 LAB — CBC
HCT: 46.5 % (ref 39.0–52.0)
Hemoglobin: 15.8 g/dL (ref 13.0–17.0)
MCH: 31.1 pg (ref 26.0–34.0)
MCHC: 34 g/dL (ref 30.0–36.0)
MCV: 91.5 fL (ref 80.0–100.0)
Platelets: 470 10*3/uL — ABNORMAL HIGH (ref 150–400)
RBC: 5.08 MIL/uL (ref 4.22–5.81)
RDW: 12 % (ref 11.5–15.5)
WBC: 7.2 10*3/uL (ref 4.0–10.5)
nRBC: 0 % (ref 0.0–0.2)

## 2021-09-08 LAB — BASIC METABOLIC PANEL
Anion gap: 6 (ref 5–15)
BUN: 13 mg/dL (ref 6–20)
CO2: 28 mmol/L (ref 22–32)
Calcium: 7.9 mg/dL — ABNORMAL LOW (ref 8.9–10.3)
Chloride: 98 mmol/L (ref 98–111)
Creatinine, Ser: 0.62 mg/dL (ref 0.61–1.24)
GFR, Estimated: >60 mL/min (ref >60)
Glucose, Bld: 134 mg/dL — ABNORMAL HIGH (ref 70–99)
Potassium: 3.9 mmol/L (ref 3.5–5.1)
Sodium: 132 mmol/L — ABNORMAL LOW (ref 135–145)

## 2021-09-08 LAB — ECHOCARDIOGRAM COMPLETE
AR max vel: 2.81 cm2
AV Area VTI: 2.79 cm2
AV Area mean vel: 2.92 cm2
AV Mean grad: 5 mmHg
AV Peak grad: 9.4 mmHg
Ao pk vel: 1.53 m/s
Area-P 1/2: 5.66 cm2
MV VTI: 2.74 cm2
S' Lateral: 2.66 cm
Weight: 3040.58 oz

## 2021-09-08 LAB — PROTEIN / CREATININE RATIO, URINE
Creatinine, Urine: 30 mg/dL
Protein Creatinine Ratio: 11.83 mg/mg{Cre} — ABNORMAL HIGH (ref 0.00–0.15)
Total Protein, Urine: 355 mg/dL

## 2021-09-08 LAB — ALBUMIN: Albumin: 1.7 g/dL — ABNORMAL LOW (ref 3.5–5.0)

## 2021-09-08 LAB — HIV ANTIBODY (ROUTINE TESTING W REFLEX): HIV Screen 4th Generation wRfx: NONREACTIVE

## 2021-09-08 MED ORDER — ALBUTEROL SULFATE (2.5 MG/3ML) 0.083% IN NEBU: 2.5000 mg | INHALATION_SOLUTION | Freq: Four times a day (QID) | RESPIRATORY_TRACT | Status: DC | PRN

## 2021-09-08 MED ORDER — FUROSEMIDE 10 MG/ML IJ SOLN
60.0000 mg | Freq: Two times a day (BID) | INTRAMUSCULAR | Status: DC
  Administered 2021-09-08 – 2021-09-10 (×5): 60 mg via INTRAVENOUS
  Filled 2021-09-08 (×5): qty 8

## 2021-09-08 NOTE — ED Notes (Signed)
[facility] RN aware of assigned bed 

## 2021-09-08 NOTE — Progress Notes (Signed)
[facility]  ROUNDING NOTE   Subjective:   Patient was seen by me in the office on 11/16 where it was noted that he was having more peripheral edema and home urine dipsticks with proteinuria. Found to have 3.4 grams of proteinuria. He was then asked to restart high dose prednisone at 60mg  PO daily. He was to continue his losartan and furosemide.  Patient states he has been going up on his furosemide dose. Currently at 60mg  daily. Also started on spironolactone, fluid restriction and salt restriction. However patient is now greater than 30 pounds above his original weight.   Patient asked to present to the Emergency Department after failing outpatient diuretic therapy.   Patient seen resting in bed, father at bedside Alert and oriented Able to tolerate small meals today Denies nausea and vomiting Denies shortness of breath States edema has improved and anxious to know treatment options    Objective:  Vital signs in last 24 hours:  Temp:  [97.6 F (36.4 C)-98.2 F (36.8 C)] 97.6 F (36.4 C) (12/07 0738) Pulse Rate:  [65-98] 85 (12/07 1000) Resp:  [10-25] 10 (12/07 1000) BP: (119-146)/(65-100) 128/73 (12/07 1000) SpO2:  [94 %-100 %] 96 % (12/07 1000) Weight:  [86.2 kg] 86.2 kg (12/06 1700)  Weight change:  Filed Weights   09/07/21 1700  Weight: 86.2 kg    Intake/Output: I/O last 3 completed shifts: In: 89.7 [IV Piggyback:89.7] Out: 1500 [Urine:1500]   Intake/Output this shift:  No intake/output data recorded.  Physical Exam: General: NAD, laying in bed  Head: Normocephalic, atraumatic. Moist oral mucosal membranes  Eyes: Anicteric, PERRL  Lungs:  Clear to auscultation, normal effort  Heart: Regular rate and rhythm  Abdomen:  Soft, nontender,   Extremities:  + peripheral edema.  Neurologic: Nonfocal, moving all four extremities  Skin: No lesions       Basic Metabolic Panel: Recent Labs  Lab 09/07/21 1418 09/08/21 0712  NA 128* 132*  K 3.9 3.9   CL 92* 98  CO2 31 28  GLUCOSE 95 134*  BUN 15 13  CREATININE 0.80 0.62  CALCIUM 7.9* 7.9*     Liver Function Tests: Recent Labs  Lab 09/07/21 1418 09/08/21 0645  AST 18  --   ALT 15  --   ALKPHOS 77  --   BILITOT 0.6  --   PROT 4.5*  --   ALBUMIN <1.5* 1.7*    No results for input(s): LIPASE, AMYLASE in the last 168 hours. No results for input(s): AMMONIA in the last 168 hours.  CBC: Recent Labs  Lab 09/07/21 1418 09/08/21 0712  WBC 9.3 7.2  NEUTROABS 3.8  --   HGB 17.4* 15.8  HCT 52.2* 46.5  MCV 91.6 91.5  PLT 618* 470*     Cardiac Enzymes: No results for input(s): CKTOTAL, CKMB, CKMBINDEX, TROPONINI in the last 168 hours.  BNP: Invalid input(s): POCBNP  CBG: No results for input(s): GLUCAP in the last 168 hours.  Microbiology: Results for orders placed or performed during the hospital encounter of 09/07/21  Resp Panel by RT-PCR (Flu A&B, Covid) Nasopharyngeal Swab     Status: None   Collection Time: 09/07/21  3:43 PM   Specimen: Nasopharyngeal Swab; Nasopharyngeal(NP) swabs in vial transport medium  Result Value Ref Range Status   SARS Coronavirus 2 by RT PCR NEGATIVE NEGATIVE Final    Comment: (NOTE) SARS-CoV-2 target nucleic acids are NOT DETECTED.  The SARS-CoV-2 RNA is generally detectable in upper respiratory specimens during the  acute phase of infection. The lowest concentration of SARS-CoV-2 viral copies this assay can detect is 138 copies/mL. A negative result does not preclude SARS-Cov-2 infection and should not be used as the sole basis for treatment or other patient management decisions. A negative result may occur with  improper specimen collection/handling, submission of specimen other than nasopharyngeal swab, presence of viral mutation(s) within the areas targeted by this assay, and inadequate number of viral copies(<138 copies/mL). A negative result must be combined with clinical observations, patient history, and  epidemiological information. The expected result is Negative.  Fact Sheet for Patients:  EntrepreneurPulse.com.au  Fact Sheet for Healthcare Providers:  IncredibleEmployment.be  This test is no t yet approved or cleared by the [place] FDA and  has been authorized for detection and/or diagnosis of SARS-CoV-2 by FDA under an Emergency Use Authorization (EUA). This EUA will remain  in effect (meaning this test can be used) for the duration of the COVID-19 declaration under Section 564(b)(1) of the Act, 21 U.S.C.section 360bbb-3(b)(1), unless the authorization is terminated  or revoked sooner.       Influenza A by PCR NEGATIVE NEGATIVE Final   Influenza B by PCR NEGATIVE NEGATIVE Final    Comment: (NOTE) The Xpert Xpress SARS-CoV-2/FLU/RSV plus assay is intended as an aid in the diagnosis of influenza from Nasopharyngeal swab specimens and should not be used as a sole basis for treatment. Nasal washings and aspirates are unacceptable for Xpert Xpress SARS-CoV-2/FLU/RSV testing.  Fact Sheet for Patients: EntrepreneurPulse.com.au  Fact Sheet for Healthcare Providers: IncredibleEmployment.be  This test is not yet approved or cleared by the [place] FDA and has been authorized for detection and/or diagnosis of SARS-CoV-2 by FDA under an Emergency Use Authorization (EUA). This EUA will remain in effect (meaning this test can be used) for the duration of the COVID-19 declaration under Section 564(b)(1) of the Act, 21 U.S.C. section 360bbb-3(b)(1), unless the authorization is terminated or revoked.  Performed at [facility], [address] [place], [place] [address]     Coagulation Studies: No results for input(s): LABPROT, INR in the last 72 hours.  Urinalysis: Recent Labs    09/07/21 1543  COLORURINE YELLOW*  LABSPEC 1.017  PHURINE 6.0  GLUCOSEU NEGATIVE  HGBUR SMALL*   BILIRUBINUR NEGATIVE  KETONESUR NEGATIVE  PROTEINUR >=300*  NITRITE NEGATIVE  LEUKOCYTESUR NEGATIVE       Imaging: US Venous Img Lower Bilateral  Result Date: 09/07/2021 CLINICAL DATA:  Bilateral lower extremity swelling EXAM: Bilateral LOWER EXTREMITY VENOUS DOPPLER ULTRASOUND TECHNIQUE: Gray-scale sonography with compression, as well as color and duplex ultrasound, were performed to evaluate the deep venous system(s) from the level of the common femoral vein through the popliteal and proximal calf veins. COMPARISON:  None. FINDINGS: VENOUS Normal compressibility of the common femoral, superficial femoral, and popliteal veins, as well as the visualized calf veins. Visualized portions of profunda femoral vein and great saphenous vein unremarkable. No filling defects to suggest DVT on grayscale or color Doppler imaging. Doppler waveforms show normal direction of venous flow, normal respiratory plasticity and response to augmentation. Limited views of the contralateral common femoral vein are unremarkable. OTHER Fluid collections in the popliteal fossa is, on the right measuring 5.5 x 0.7 x 2 cm and on the left measuring 3.5 x 0.9 x 1.9 cm. Limitations: none IMPRESSION: 1. Negative for acute bilateral lower extremity DVT. 2. Bilateral popliteal fossa cysts. Electronically Signed   By: Donavan Foil M.D.   On: 09/07/2021 16:44  DG Chest Portable 1 View  Result Date: 09/07/2021 CLINICAL DATA:  Shortness of breath. EXAM: PORTABLE CHEST 1 VIEW COMPARISON:  None. FINDINGS: The heart size and mediastinal contours are within normal limits. Mild bibasilar subsegmental atelectasis is noted with small bilateral pleural effusions. The visualized skeletal structures are unremarkable. IMPRESSION: Mild bibasilar subsegmental atelectasis with small bilateral pleural effusions. Electronically Signed   By: Lupita Raider M.D.   On: 09/07/2021 15:51     Medications:    albumin human 25 g (09/08/21 0657)     albuterol  2.5 mg Nebulization Q6H   enoxaparin (LOVENOX) injection  40 mg Subcutaneous Q24H   furosemide  60 mg Intravenous Q12H   losartan  25 mg Oral Daily   methylPREDNISolone (SOLU-MEDROL) injection  125 mg Intravenous Q24H   spironolactone  25 mg Oral Daily   acetaminophen **OR** acetaminophen, hydrALAZINE, zolpidem  Assessment/ Plan:  Mr. Larry Obrien is a 25 y.o. 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 Seems to have initially achieved remission with high dose steroids. However back with relapse despite high dose steroids, losartan, and loop diuretics.   IgA nephropathy with nephrotic syndrome Proteinuria  Peripheral edema/anasarca Hypertension Hypoalbuminuria.   Failed outpatient therapy with PO furosemide, spironolactone, losartan and PO prednisone. Bilateral lower extremity dopplers negative for DVT.   -Continue IV furosemide 80mg  q12 -Albumin 1.7, continue IV albumin supplementation. - IV methylprednisolone 120mg  x3 days.  - Continue losartan and spironolactone -Maintain fluid restriction  -Echo pending to rule out cardiac involvement -Will order urine creatinine protein ratio today    LOS: 1 Keily Lepp 12/7/202212:13 PM

## 2021-09-08 NOTE — Progress Notes (Signed)
Patient states he does not need scheduled nebulizers, will call if he needs prn treatment

## 2021-09-08 NOTE — Progress Notes (Signed)
Patient arrived to room 223 and was oriented to room. Patient VSS. No questions or complaints at this time.

## 2021-09-08 NOTE — Progress Notes (Signed)
PROGRESS NOTE  Larry Obrien  DOB: 1996-07-12  PCP: Rusty Aus, MD [id number]  DOA: 09/07/2021  LOS: 1 day  Hospital Day: 2  Chief Complaint  Patient presents with   Leg Swelling    Brief narrative: Larry Obrien is a 25 y.o. male with PMH significant for nephrotic syndrome currently on steroids, follows up with nephrologist Dr. Juleen China as an outpatient. Patient has nephrotic syndrome since childhood with intermittent flareups.  At 1 point, he was diagnosed as IgA nephropathy. Last flareup was few months ago which responded to high-dose of steroids. 11/16 -seen by nephrologist in the office for peripheral edema and home urine dipsticks with proteinuria.  3.4 g of proteinuria was noted.  He was restarted on prednisone 60 mg daily and continued on losartan and Lasix.  Over the course of next few days, Lasix dose was increased, Aldactone was also added.  He was also placed on fluid restriction and salt restriction.  However he is fluid retention and continued to worsen and he became 30 pound over his original weight. On 12/6, he was sent to the ED for inpatient admission and management.   Initial lab in the ED showed normal creatinine, urine protein more than 300. Admitted to hospitalist service Nephrology consultation was obtained.  Patient was started on IV steroids, IV Lasix, IV albumin.  Subjective: Patient was seen and examined this morning.  Pleasant young Caucasian male.  Lying on bed.  Not in distress.  Father at bedside.  Assessment/Plan: Acute flareup of nephrotic syndrome -Currently on IV Solu-Medrol 125 mg daily for 3 days, IV Lasix 60 mg twice daily  -Also continued on losartan and Aldactone.   -Fluid restriction -Nephrology to continue to follow-up -Echocardiogram ordered to rule out coexistent CHF.   Hypoalbuminemia -Albumin level lower than 1.5, likely due to proteinuria as part of nephrotic syndrome itself. -IV albumin was given yesterday.   Mobility:  Encourage ambulation Living condition: Lives at home Goals of care:   Code Status: Full Code  Nutritional status: Body mass index is 25.77 kg/m.      Diet:  Diet Order             Diet 2 gram sodium Room service appropriate? Yes; Fluid consistency: Thin; Fluid restriction: 1500 mL Fluid  Diet effective now                  DVT prophylaxis:  enoxaparin (LOVENOX) injection 40 mg Start: 09/07/21 2200   Antimicrobials: None Fluid: None Consultants: Nephrology Family Communication: Father at bedside  Status is: Inpatient  Continue in-hospital care because: Needs IV steroids, IV Lasix, further clinical monitoring Level of care: Telemetry Medical   Dispo: The patient is from: Home              Anticipated d/c is to: Home.  Pending clinical course              Patient currently is not medically stable to d/c.   Difficult to place patient No     Infusions:   albumin human 25 g (09/08/21 0657)    Scheduled Meds:  albuterol  2.5 mg Nebulization Q6H   enoxaparin (LOVENOX) injection  40 mg Subcutaneous Q24H   furosemide  60 mg Intravenous Q12H   losartan  25 mg Oral Daily   methylPREDNISolone (SOLU-MEDROL) injection  125 mg Intravenous Q24H   spironolactone  25 mg Oral Daily    PRN meds: acetaminophen **OR** acetaminophen, hydrALAZINE, zolpidem   Antimicrobials: Anti-infectives (  From admission, onward)    None       Objective: Vitals:   09/08/21 0738 09/08/21 1000  BP: 140/73 128/73  Pulse: 84 85  Resp: 20 10  Temp: 97.6 F (36.4 C)   SpO2: 98% 96%    Intake/Output Summary (Last 24 hours) at 09/08/2021 1144 Last data filed at 09/07/2021 2321 Gross per 24 hour  Intake 89.72 ml  Output 1500 ml  Net -1410.28 ml   Filed Weights   09/07/21 1700  Weight: 86.2 kg   Weight change:  Body mass index is 25.77 kg/m.   Physical Exam: General exam: Pleasant, young Caucasian male.  Not in physical distress Skin: No rashes, lesions or ulcers. HEENT:  Atraumatic, normocephalic, no obvious bleeding Lungs: Clear to auscultation bilaterally CVS: Regular rate and rhythm, no murmur GI/Abd soft, nontender, nondistended, bowel sound present CNS: Alert, awake, oriented x3 Psychiatry: Mood appropriate Extremities: Pedal edema 1+ bilaterally, no calf tenderness  Data Review: I have personally reviewed the laboratory data and studies available.  F/u labs ordered Unresulted Labs (From admission, onward)     Start     Ordered   09/08/21 1010  Protein / creatinine ratio, urine  Once,   STAT        09/08/21 1009   09/08/21 0500  HIV Antibody (routine testing w rflx)  (HIV Antibody (Routine testing w reflex) panel)  Tomorrow morning,   STAT        09/07/21 1641   09/08/21 0500  Basic metabolic panel  Daily,   STAT      09/07/21 1641   09/08/21 0500  CBC  Daily,   STAT      09/07/21 1641            Signed, Lorin Glass, MD [facility] 09/08/2021

## 2021-09-09 ENCOUNTER — Encounter: Payer: Self-pay | Admitting: Internal Medicine

## 2021-09-09 DIAGNOSIS — R809 Proteinuria, unspecified: Secondary | ICD-10-CM

## 2021-09-09 DIAGNOSIS — N028 Recurrent and persistent hematuria with other morphologic changes: Secondary | ICD-10-CM

## 2021-09-09 HISTORY — DX: Proteinuria, unspecified: R80.9

## 2021-09-09 HISTORY — DX: Recurrent and persistent hematuria with other morphologic changes: N02.8

## 2021-09-09 LAB — CBC
HCT: 44 % (ref 39.0–52.0)
Hemoglobin: 14.7 g/dL (ref 13.0–17.0)
MCH: 30.9 pg (ref 26.0–34.0)
MCHC: 33.4 g/dL (ref 30.0–36.0)
MCV: 92.6 fL (ref 80.0–100.0)
Platelets: 464 10*3/uL — ABNORMAL HIGH (ref 150–400)
RBC: 4.75 MIL/uL (ref 4.22–5.81)
RDW: 12 % (ref 11.5–15.5)
WBC: 7.6 10*3/uL (ref 4.0–10.5)
nRBC: 0 % (ref 0.0–0.2)

## 2021-09-09 LAB — HEPATIC FUNCTION PANEL
ALT: 14 U/L (ref 0–44)
AST: 17 U/L (ref 15–41)
Albumin: 2.4 g/dL — ABNORMAL LOW (ref 3.5–5.0)
Alkaline Phosphatase: 70 U/L (ref 38–126)
Bilirubin, Direct: <0.1 mg/dL (ref 0.0–0.2)
Total Bilirubin: 0.3 mg/dL (ref 0.3–1.2)
Total Protein: 4.8 g/dL — ABNORMAL LOW (ref 6.5–8.1)

## 2021-09-09 LAB — BASIC METABOLIC PANEL
Anion gap: 4 — ABNORMAL LOW (ref 5–15)
BUN: 13 mg/dL (ref 6–20)
CO2: 27 mmol/L (ref 22–32)
Calcium: 7.9 mg/dL — ABNORMAL LOW (ref 8.9–10.3)
Chloride: 102 mmol/L (ref 98–111)
Creatinine, Ser: 0.65 mg/dL (ref 0.61–1.24)
GFR, Estimated: >60 mL/min (ref >60)
Glucose, Bld: 148 mg/dL — ABNORMAL HIGH (ref 70–99)
Potassium: 3.9 mmol/L (ref 3.5–5.1)
Sodium: 133 mmol/L — ABNORMAL LOW (ref 135–145)

## 2021-09-09 LAB — PROTEIN / CREATININE RATIO, URINE
Creatinine, Urine: 28 mg/dL
Protein Creatinine Ratio: 21.5 mg/mg{Cre} — ABNORMAL HIGH (ref 0.00–0.15)
Total Protein, Urine: 602 mg/dL

## 2021-09-09 IMAGING — US US BIOPSY
1 series · 13 of 14 positions shown · non-contrast
Comparison: none

INDICATION: Nephrotic syndrome, positive CHAGY

[Series 1: us biopsy · 0.24mm/px · 14 acquisitions, 13 frames shown]
[im 1/14]
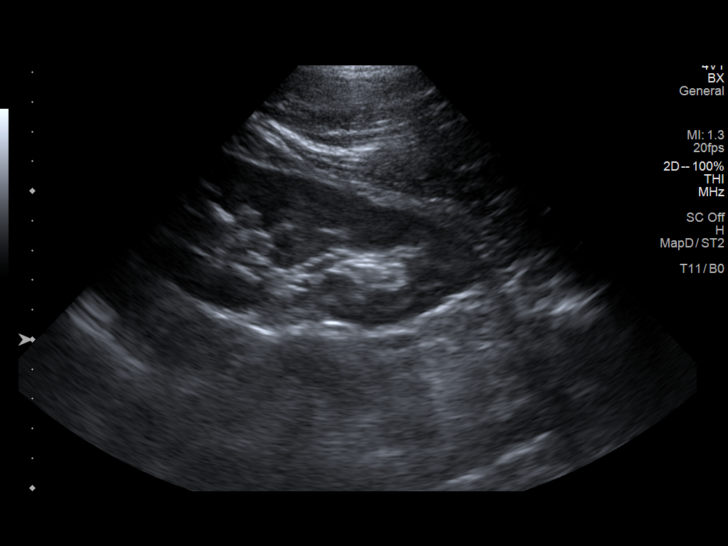
[im 2/14]
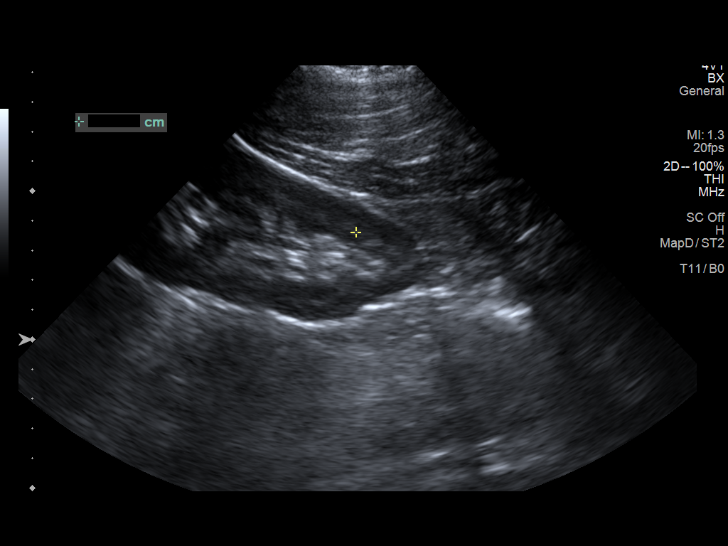
[im 3/14]
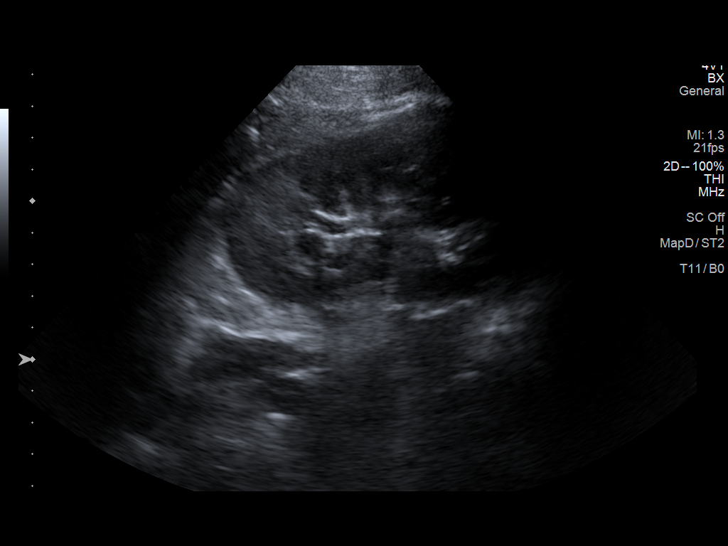
[im 4/14]
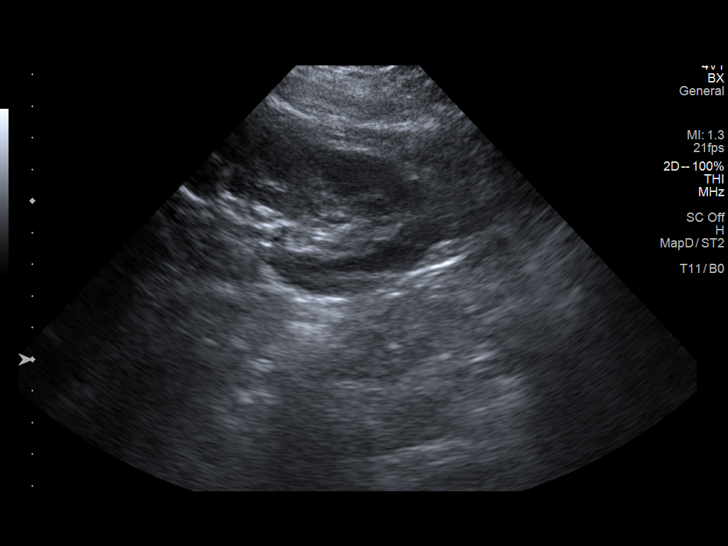
[im 5/14]
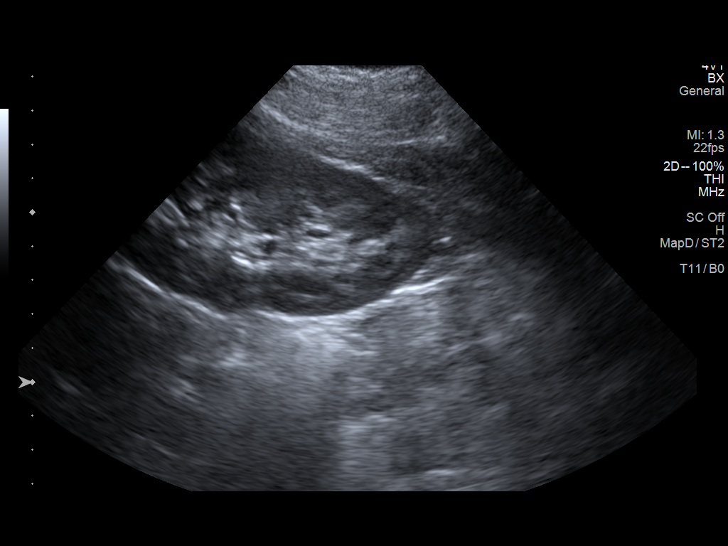
[im 6/14]
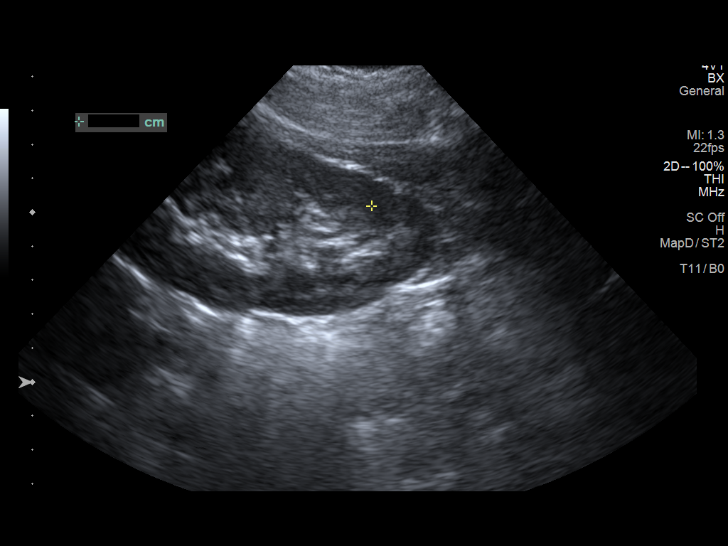
[im 8/14]
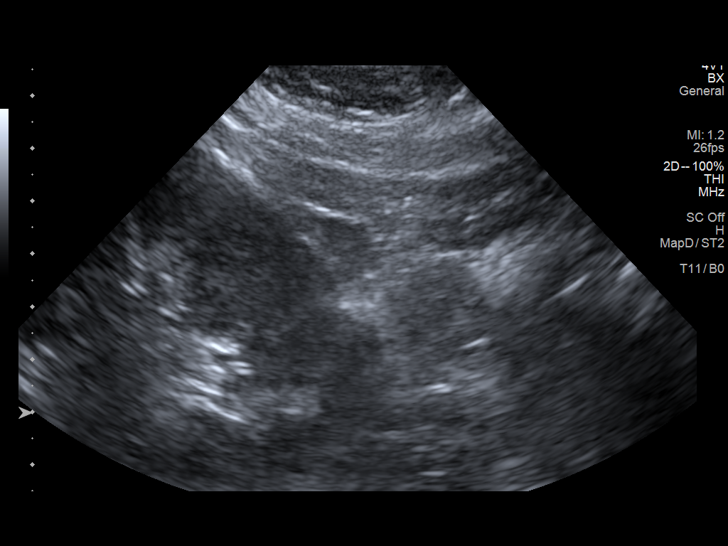
[im 9/14]
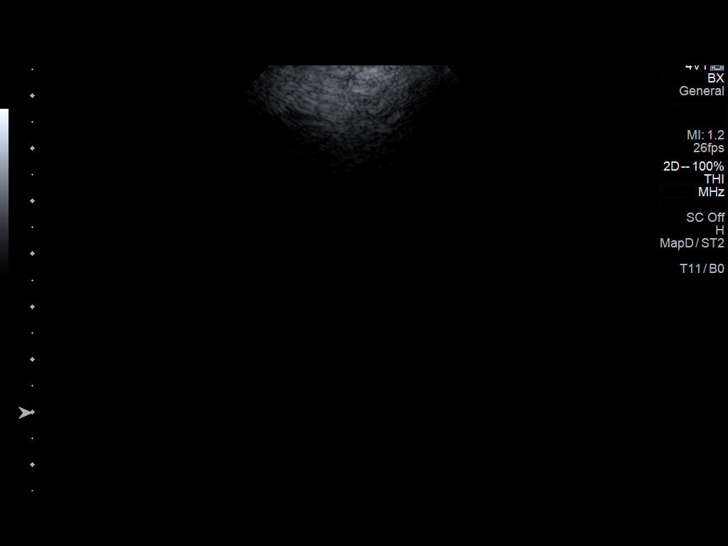
[im 10/14]
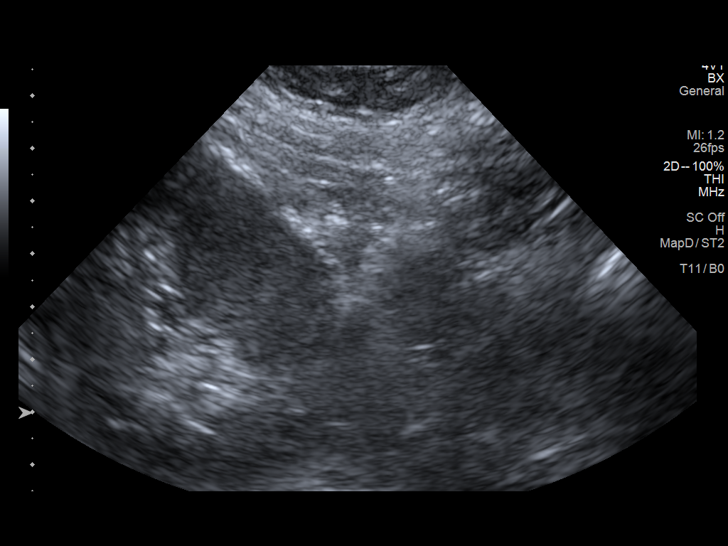
[im 11/14]
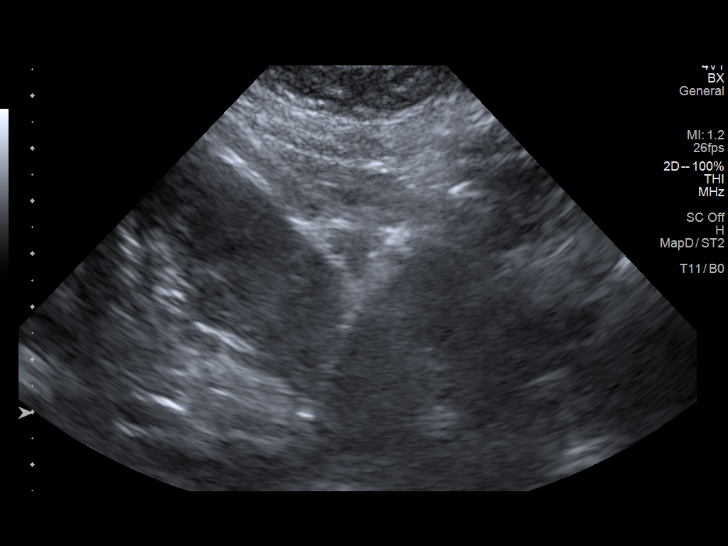
[im 12/14]
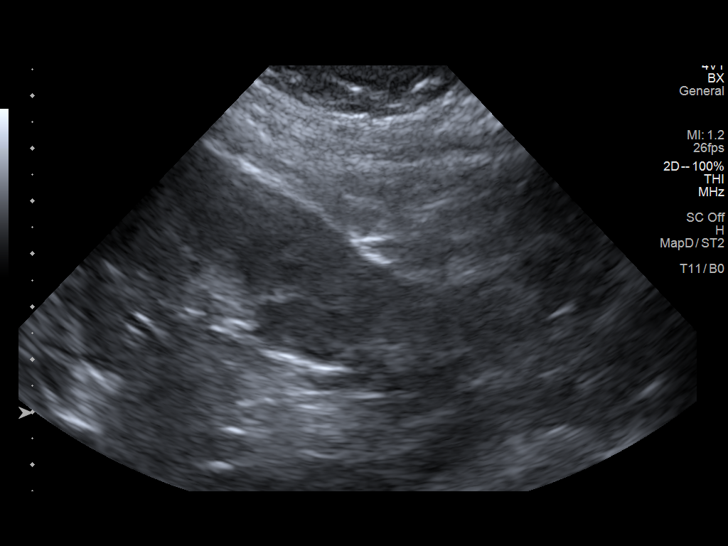
[im 13/14]
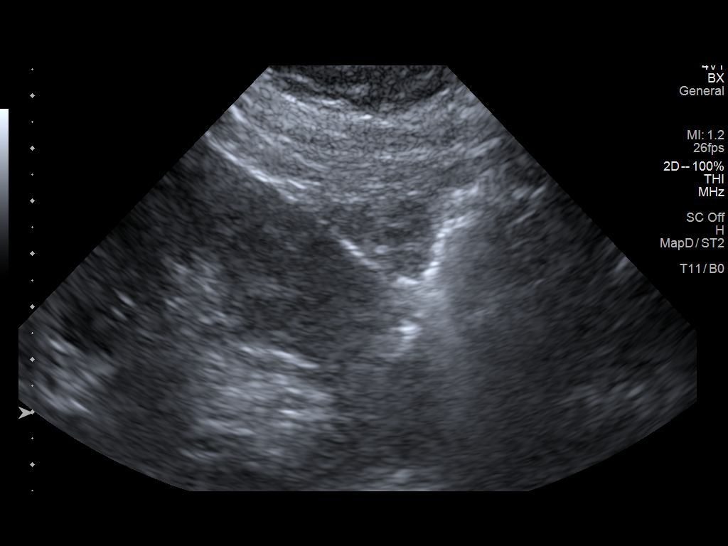
[im 14/14]
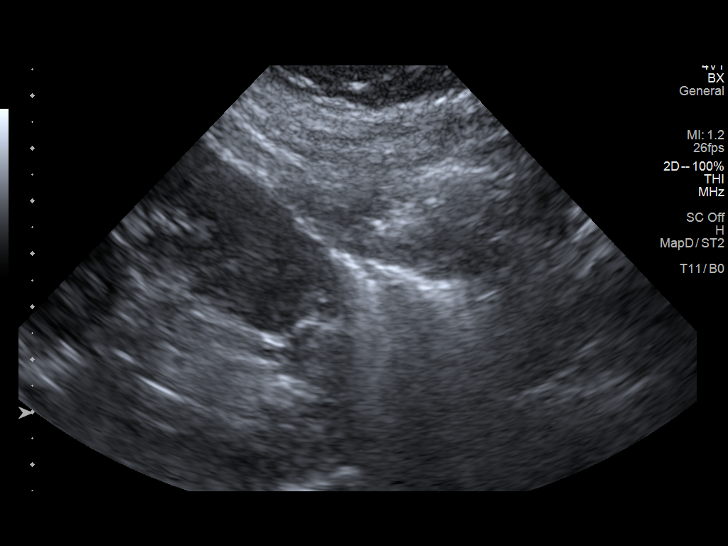

[13 of 14 positions shown; findings below may reference images not displayed]

EXAM:
ULTRASOUND LEFT RENAL CORE BIOPSY

MEDICATIONS:
1% LIDOCAINE LOCAL

ANESTHESIA/SEDATION:
Moderate (conscious) sedation was employed during this procedure. A
total of Versed 2.0 mg and Fentanyl 100 mcg was administered
intravenously.

Moderate Sedation Time: 15 minutes. The patient's level of
consciousness and vital signs were monitored continuously by
radiology nursing throughout the procedure under my direct
supervision.

FLUOROSCOPY TIME:  Fluoroscopy Time: None.

COMPLICATIONS:
None immediate.

PROCEDURE:
Informed written consent was obtained from the patient after a
thorough discussion of the procedural risks, benefits and
alternatives. All questions were addressed. Maximal Sterile Barrier
Technique was utilized including caps, mask, sterile gowns, sterile
gloves, sterile drape, hand hygiene and skin antiseptic. A timeout
was performed prior to the initiation of the procedure.

Preliminary ultrasound performed. Patient positioned prone. Left
kidney lower pole was localized and marked.

Under sterile conditions and local anesthesia, a guide needle was
advanced to the lower pole cortex. Through the access, 3 18 gauge
core biopsy needle passes were made through the cortex. Two intact 2
cm length core biopsies obtained. These were placed on a saline
moistened Telfa. Needle tract occluded with Gel-Foam slurry.
Postprocedure imaging demonstrates no hemorrhage or hematoma.
Patient tolerated biopsy well.
IMPRESSION: Successful ultrasound left renal random core biopsy.

## 2021-09-09 MED ORDER — PREDNISONE 20 MG PO TABS
80.0000 mg | ORAL_TABLET | Freq: Every day | ORAL | Status: DC
  Administered 2021-09-10: 80 mg via ORAL
  Filled 2021-09-09: qty 4

## 2021-09-09 MED ORDER — SULFAMETHOXAZOLE-TRIMETHOPRIM 800-160 MG PO TABS
1.0000 | ORAL_TABLET | Freq: Every day | ORAL | Status: DC
  Administered 2021-09-09 – 2021-09-10 (×2): 1 via ORAL
  Filled 2021-09-09 (×2): qty 1

## 2021-09-09 NOTE — Progress Notes (Signed)
PROGRESS NOTE  Larry Obrien  DOB: 09-01-1996  PCP: Danella Penton, MD FXJ:[id number]  DOA: 09/07/2021  LOS: 2 days  Hospital Day: 3  Chief Complaint  Patient presents with   Leg Swelling    Brief narrative: Larry Obrien is a 25 y.o. male with PMH significant for nephrotic syndrome currently on steroids, follows up with nephrologist Dr. Wynelle Link as an outpatient. Patient has nephrotic syndrome since childhood with intermittent flareups.  At 1 point, he was diagnosed as IgA nephropathy. Last flareup was few months ago which responded to high-dose of steroids. 11/16 -seen by nephrologist in the office for peripheral edema and home urine dipsticks with proteinuria.  3.4 g of proteinuria was noted.  He was restarted on prednisone 60 mg daily and continued on losartan and Lasix.  Over the course of next few days, Lasix dose was increased, Aldactone was also added.  He was also placed on fluid restriction and salt restriction.  However he is fluid retention and continued to worsen and he became 30 pound over his original weight. On 12/6, he was sent to the ED for inpatient admission and management.   Initial lab in the ED showed normal creatinine, urine protein more than 300. Admitted to hospitalist service Nephrology consultation was obtained.  Patient was started on IV steroids, IV Lasix, IV albumin.  Subjective: Patient was seen and examined this morning.  Lying down in bed.  Not in distress.  No new symptoms.  Ongoing diuresis with IV Lasix. Yesterday, urine studies showed urine protein/creatinine ratio elevated to 11.83.  Assessment/Plan: Acute flareup of nephrotic syndrome Previous biopsy showed IgA nephropathy. -Currently on IV Solu-Medrol 125 mg daily for 3 days, IV Lasix 60 mg twice daily  -Also continued on losartan and Aldactone, fluid restriction -Noted a plan from nephrology to repeat urine protein/creatinine ratio today.  Patient mental requiring  biopsy. -Echocardiogram showed normal EF and did not show any wall motion abnormality   Hypoalbuminemia -Albumin level lower than 1.5, likely due to proteinuria as part of nephrotic syndrome itself. -IV albumin per nephrology.   Mobility: Encourage ambulation Living condition: Lives at home Goals of care:   Code Status: Full Code  Nutritional status: Body mass index is 25.77 kg/m.      Diet:  Diet Order             Diet 2 gram sodium Room service appropriate? Yes; Fluid consistency: Thin; Fluid restriction: 1500 mL Fluid  Diet effective now                  DVT prophylaxis:  enoxaparin (LOVENOX) injection 40 mg Start: 09/07/21 2200   Antimicrobials: None Fluid: None Consultants: Nephrology Family Communication: None at bedside  Status is: Inpatient  Continue in-hospital care because: Needs IV steroids, IV Lasix, further clinical monitoring Level of care: Telemetry Medical   Dispo: The patient is from: Home              Anticipated d/c is to: Home.  Pending clinical course              Patient currently is not medically stable to d/c.   Difficult to place patient No     Infusions:   albumin human 25 g (09/09/21 0525)    Scheduled Meds:  enoxaparin (LOVENOX) injection  40 mg Subcutaneous Q24H   furosemide  60 mg Intravenous Q12H   losartan  25 mg Oral Daily   spironolactone  25 mg Oral Daily  PRN meds: acetaminophen **OR** acetaminophen, albuterol, hydrALAZINE, zolpidem   Antimicrobials: Anti-infectives (From admission, onward)    None       Objective: Vitals:   09/09/21 0403 09/09/21 0859  BP: (!) 142/82 (!) 154/95  Pulse: (!) 101 90  Resp: 16 16  Temp: (!) 97.5 F (36.4 C) 97.9 F (36.6 C)  SpO2: 97% 100%    Intake/Output Summary (Last 24 hours) at 09/09/2021 1344 Last data filed at 09/09/2021 0900 Gross per 24 hour  Intake 258 ml  Output 600 ml  Net -342 ml    Filed Weights   09/07/21 1700 09/08/21 1358  Weight: 86.2 kg 86.2  kg   Weight change: -0.016 kg Body mass index is 25.77 kg/m.   Physical Exam: General exam: Pleasant, young Caucasian male.  Not in physical distress Skin: No rashes, lesions or ulcers. HEENT: Atraumatic, normocephalic, no obvious bleeding Lungs: Clear to auscultation bilaterally CVS: Regular rate and rhythm, no murmur GI/Abd soft, nontender, nondistended, bowel sound present CNS: Alert, awake, oriented x3 Psychiatry: Mood appropriate Extremities: Pedal edema 1+ bilaterally, no calf tenderness  Data Review: I have personally reviewed the laboratory data and studies available.  F/u labs ordered Unresulted Labs (From admission, onward)     Start     Ordered   09/08/21 0500  Basic metabolic panel  Daily,   STAT      09/07/21 1641   09/08/21 0500  CBC  Daily,   STAT      09/07/21 1641            Signed, Lorin Glass, MD [facility] 09/09/2021

## 2021-09-09 NOTE — Plan of Care (Signed)

## 2021-09-09 NOTE — Progress Notes (Signed)
[facility]  ROUNDING NOTE   Subjective:   Patient was seen by me in the office on 11/16 where it was noted that he was having more peripheral edema and home urine dipsticks with proteinuria. Found to have 3.4 grams of proteinuria. He was then asked to restart high dose prednisone at 60mg  PO daily. He was to continue his losartan and furosemide.  Patient states he has been going up on his furosemide dose. Currently at 60mg  daily. Also started on spironolactone, fluid restriction and salt restriction. However patient is now greater than 30 pounds above his original weight.   Patient asked to present to the Emergency Department after failing outpatient diuretic therapy.   Patient seen laying in bed States he feel better today Tolerating melas and denies shortness of breath  Urine protein/creatinine ratio 11.83 Edema improved    Objective:  Vital signs in last 24 hours:  Temp:  [97.5 F (36.4 C)-98.6 F (37 C)] 97.9 F (36.6 C) (12/08 0859) Pulse Rate:  [70-101] 90 (12/08 0859) Resp:  [16-20] 16 (12/08 0859) BP: (121-154)/(64-95) 154/95 (12/08 0859) SpO2:  [97 %-100 %] 100 % (12/08 0859) Weight:  [86.2 kg] 86.2 kg (12/07 1358)  Weight change: -0.016 kg Filed Weights   09/07/21 1700 09/08/21 1358  Weight: 86.2 kg 86.2 kg    Intake/Output: I/O last 3 completed shifts: In: 282.7 [IV Piggyback:282.7] Out: 600 [Urine:600]   Intake/Output this shift:  Total I/O In: 65 [P.O.:65] Out: -   Physical Exam: General: NAD, laying in bed  Head: Normocephalic, atraumatic. Moist oral mucosal membranes  Eyes: Anicteric  Lungs:  Clear to auscultation, normal effort  Heart: Regular rate and rhythm  Abdomen:  Soft, nontender,   Extremities:  + peripheral edema.  Neurologic: Nonfocal, moving all four extremities  Skin: No lesions       Basic Metabolic Panel: Recent Labs  Lab 09/07/21 1418 09/08/21 0712 09/09/21 0444  NA 128* 132* 133*  K 3.9 3.9 3.9  CL 92* 98  102  CO2 31 28 27   GLUCOSE 95 134* 148*  BUN 15 13 13   CREATININE 0.80 0.62 0.65  CALCIUM 7.9* 7.9* 7.9*     Liver Function Tests: Recent Labs  Lab 09/07/21 1418 09/08/21 0645 09/09/21 0444  AST 18  --  17  ALT 15  --  14  ALKPHOS 77  --  70  BILITOT 0.6  --  0.3  PROT 4.5*  --  4.8*  ALBUMIN <1.5* 1.7* 2.4*    No results for input(s): LIPASE, AMYLASE in the last 168 hours. No results for input(s): AMMONIA in the last 168 hours.  CBC: Recent Labs  Lab 09/07/21 1418 09/08/21 0712 09/09/21 0444  WBC 9.3 7.2 7.6  NEUTROABS 3.8  --   --   HGB 17.4* 15.8 14.7  HCT 52.2* 46.5 44.0  MCV 91.6 91.5 92.6  PLT 618* 470* 464*     Cardiac Enzymes: No results for input(s): CKTOTAL, CKMB, CKMBINDEX, TROPONINI in the last 168 hours.  BNP: Invalid input(s): POCBNP  CBG: No results for input(s): GLUCAP in the last 168 hours.  Microbiology: Results for orders placed or performed during the hospital encounter of 09/07/21  Resp Panel by RT-PCR (Flu A&B, Covid) Nasopharyngeal Swab     Status: None   Collection Time: 09/07/21  3:43 PM   Specimen: Nasopharyngeal Swab; Nasopharyngeal(NP) swabs in vial transport medium  Result Value Ref Range Status   SARS Coronavirus 2 by RT PCR NEGATIVE NEGATIVE Final  Comment: (NOTE) SARS-CoV-2 target nucleic acids are NOT DETECTED.  The SARS-CoV-2 RNA is generally detectable in upper respiratory specimens during the acute phase of infection. The lowest concentration of SARS-CoV-2 viral copies this assay can detect is 138 copies/mL. A negative result does not preclude SARS-Cov-2 infection and should not be used as the sole basis for treatment or other patient management decisions. A negative result may occur with  improper specimen collection/handling, submission of specimen other than nasopharyngeal swab, presence of viral mutation(s) within the areas targeted by this assay, and inadequate number of viral copies(<138 copies/mL). A  negative result must be combined with clinical observations, patient history, and epidemiological information. The expected result is Negative.  Fact Sheet for Patients:  BloggerCourse.com  Fact Sheet for Healthcare Providers:  SeriousBroker.it  This test is no t yet approved or cleared by the [place] FDA and  has been authorized for detection and/or diagnosis of SARS-CoV-2 by FDA under an Emergency Use Authorization (EUA). This EUA will remain  in effect (meaning this test can be used) for the duration of the COVID-19 declaration under Section 564(b)(1) of the Act, 21 U.S.C.section 360bbb-3(b)(1), unless the authorization is terminated  or revoked sooner.       Influenza A by PCR NEGATIVE NEGATIVE Final   Influenza B by PCR NEGATIVE NEGATIVE Final    Comment: (NOTE) The Xpert Xpress SARS-CoV-2/FLU/RSV plus assay is intended as an aid in the diagnosis of influenza from Nasopharyngeal swab specimens and should not be used as a sole basis for treatment. Nasal washings and aspirates are unacceptable for Xpert Xpress SARS-CoV-2/FLU/RSV testing.  Fact Sheet for Patients: BloggerCourse.com  Fact Sheet for Healthcare Providers: SeriousBroker.it  This test is not yet approved or cleared by the [place] FDA and has been authorized for detection and/or diagnosis of SARS-CoV-2 by FDA under an Emergency Use Authorization (EUA). This EUA will remain in effect (meaning this test can be used) for the duration of the COVID-19 declaration under Section 564(b)(1) of the Act, 21 U.S.C. section 360bbb-3(b)(1), unless the authorization is terminated or revoked.  Performed at [facility], [address] [place], [place] [address]     Coagulation Studies: No results for input(s): LABPROT, INR in the last 72 hours.  Urinalysis: Recent Labs    09/07/21 1543   COLORURINE YELLOW*  LABSPEC 1.017  PHURINE 6.0  GLUCOSEU NEGATIVE  HGBUR SMALL*  BILIRUBINUR NEGATIVE  KETONESUR NEGATIVE  PROTEINUR >=300*  NITRITE NEGATIVE  LEUKOCYTESUR NEGATIVE       Imaging: US Venous Img Lower Bilateral  Result Date: 09/07/2021 CLINICAL DATA:  Bilateral lower extremity swelling EXAM: Bilateral LOWER EXTREMITY VENOUS DOPPLER ULTRASOUND TECHNIQUE: Gray-scale sonography with compression, as well as color and duplex ultrasound, were performed to evaluate the deep venous system(s) from the level of the common femoral vein through the popliteal and proximal calf veins. COMPARISON:  None. FINDINGS: VENOUS Normal compressibility of the common femoral, superficial femoral, and popliteal veins, as well as the visualized calf veins. Visualized portions of profunda femoral vein and great saphenous vein unremarkable. No filling defects to suggest DVT on grayscale or color Doppler imaging. Doppler waveforms show normal direction of venous flow, normal respiratory plasticity and response to augmentation. Limited views of the contralateral common femoral vein are unremarkable. OTHER Fluid collections in the popliteal fossa is, on the right measuring 5.5 x 0.7 x 2 cm and on the left measuring 3.5 x 0.9 x 1.9 cm. Limitations: none IMPRESSION: 1. Negative for acute bilateral lower  extremity DVT. 2. Bilateral popliteal fossa cysts. Electronically Signed   By: Donavan Foil M.D.   On: 09/07/2021 16:44   DG Chest Portable 1 View  Result Date: 09/07/2021 CLINICAL DATA:  Shortness of breath. EXAM: PORTABLE CHEST 1 VIEW COMPARISON:  None. FINDINGS: The heart size and mediastinal contours are within normal limits. Mild bibasilar subsegmental atelectasis is noted with small bilateral pleural effusions. The visualized skeletal structures are unremarkable. IMPRESSION: Mild bibasilar subsegmental atelectasis with small bilateral pleural effusions. Electronically Signed   By: Marijo Conception M.D.    On: 09/07/2021 15:51   ECHOCARDIOGRAM COMPLETE  Result Date: 09/08/2021    ECHOCARDIOGRAM REPORT   Patient Name:   Larry Obrien Date of Exam: 09/08/2021 Medical Rec #:  [id number]        Height:       72.0 in Accession #:    [id number]       Weight:       190.0 lb Date of Birth:  [birth year]        BSA:          2.085 m Patient Age:    25 years         BP:           140/73 mmHg Patient Gender: M                HR:           86 bpm. Exam Location:  [facility] Procedure: 2D Echo, Color Doppler and Cardiac Doppler Indications:     R94.31 Abnormal ECG  History:         Patient has no prior history of Echocardiogram examinations.                  Nephrotic syndrome; Signs/Symptoms:Edema.  Sonographer:     Charmayne Sheer Referring Phys:  [id number] [place] Diagnosing Phys: Kate Sable MD IMPRESSIONS  1. Left ventricular ejection fraction, by estimation, is 65 to 70%. The left ventricle has normal function. The left ventricle has no regional wall motion abnormalities. Left ventricular diastolic parameters were normal.  2. Right ventricular systolic function is normal. The right ventricular size is normal.  3. The mitral valve is normal in structure. No evidence of mitral valve regurgitation.  4. The aortic valve is tricuspid. Aortic valve regurgitation is not visualized.  5. The inferior vena cava is normal in size with greater than 50% respiratory variability, suggesting right atrial pressure of 3 mmHg. FINDINGS  Left Ventricle: Left ventricular ejection fraction, by estimation, is 65 to 70%. The left ventricle has normal function. The left ventricle has no regional wall motion abnormalities. The left ventricular internal cavity size was normal in size. There is  no left ventricular hypertrophy. Left ventricular diastolic parameters were normal. Right Ventricle: The right ventricular size is normal. No increase in right ventricular wall thickness. Right ventricular systolic function is normal. Left Atrium: Left atrial  size was normal in size. Right Atrium: Right atrial size was normal in size. Pericardium: There is no evidence of pericardial effusion. Mitral Valve: The mitral valve is normal in structure. No evidence of mitral valve regurgitation. MV peak gradient, 7.2 mmHg. The mean mitral valve gradient is 4.0 mmHg. Tricuspid Valve: The tricuspid valve is normal in structure. Tricuspid valve regurgitation is not demonstrated. Aortic Valve: The aortic valve is tricuspid. Aortic valve regurgitation is not visualized. Aortic valve mean gradient measures 5.0 mmHg. Aortic valve peak gradient measures 9.4 mmHg. Aortic valve area,  by VTI measures 2.79 cm. Pulmonic Valve: The pulmonic valve was normal in structure. Pulmonic valve regurgitation is not visualized. Aorta: The aortic root is normal in size and structure. Venous: The inferior vena cava is normal in size with greater than 50% respiratory variability, suggesting right atrial pressure of 3 mmHg. IAS/Shunts: No atrial level shunt detected by color flow Doppler.  LEFT VENTRICLE PLAX 2D LVIDd:         4.47 cm   Diastology LVIDs:         2.66 cm   LV e' medial:    10.10 cm/s LV PW:         0.98 cm   LV E/e' medial:  9.8 LV IVS:        0.89 cm   LV e' lateral:   15.00 cm/s LVOT diam:     2.10 cm   LV E/e' lateral: 6.6 LV SV:         77 LV SV Index:   37 LVOT Area:     3.46 cm  RIGHT VENTRICLE RV Basal diam:  3.15 cm LEFT ATRIUM           Index        RIGHT ATRIUM          Index LA diam:      3.20 cm 1.53 cm/m   RA Area:     7.51 cm LA Vol (A2C): 17.9 ml 8.59 ml/m   RA Volume:   11.70 ml 5.61 ml/m LA Vol (A4C): 37.2 ml 17.84 ml/m  AORTIC VALVE                     PULMONIC VALVE AV Area (Vmax):    2.81 cm      PV Vmax:       1.25 m/s AV Area (Vmean):   2.92 cm      PV Vmean:      89.500 cm/s AV Area (VTI):     2.79 cm      PV VTI:        0.226 m AV Vmax:           153.00 cm/s   PV Peak grad:  6.2 mmHg AV Vmean:          107.000 cm/s  PV Mean grad:  4.0 mmHg AV VTI:             0.277 m AV Peak Grad:      9.4 mmHg AV Mean Grad:      5.0 mmHg LVOT Vmax:         124.00 cm/s LVOT Vmean:        90.300 cm/s LVOT VTI:          0.223 m LVOT/AV VTI ratio: 0.81  AORTA Ao Root diam: 2.80 cm MITRAL VALVE MV Area (PHT): 5.66 cm    SHUNTS MV Area VTI:   2.74 cm    Systemic VTI:  0.22 m MV Peak grad:  7.2 mmHg    Systemic Diam: 2.10 cm MV Mean grad:  4.0 mmHg MV Vmax:       1.34 m/s MV Vmean:      96.6 cm/s MV Decel Time: 134 msec MV E velocity: 99.40 cm/s MV A velocity: 95.50 cm/s MV E/A ratio:  1.04 Kate Sable MD Electronically signed by Kate Sable MD Signature Date/Time: 09/08/2021/4:50:44 PM    Final      Medications:    albumin human 25 g (09/09/21  0525)    enoxaparin (LOVENOX) injection  40 mg Subcutaneous Q24H   furosemide  60 mg Intravenous Q12H   losartan  25 mg Oral Daily   spironolactone  25 mg Oral Daily   acetaminophen **OR** acetaminophen, albuterol, hydrALAZINE, zolpidem  Assessment/ Plan:  Mr. Larry Obrien is a 25 y.o. 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 Seems to have initially achieved remission with high dose steroids. However back with relapse despite high dose steroids, losartan, and loop diuretics.   IgA nephropathy with nephrotic syndrome Proteinuria  Peripheral edema/anasarca Hypertension Hypoalbuminuria.   Failed outpatient therapy with PO furosemide, spironolactone, losartan and PO prednisone. Bilateral lower extremity dopplers negative for DVT.   -IV furosemide 80mg  q12 -Continue IV albumin supplementation. - IV methylprednisolone 120mg  x3 days.  - Continue losartan and spironolactone -Maintain fluid restriction  -Echo pending to rule out cardiac involvement -Urine protein/creatinine ratio elevated at 11.83, will order follow up to confirm, per patient request - Discussed treatment plans with patient, including the desire to repeat renal biopsy to assess progression and determine if other treatment  options are necessary. Patient would like to repeat urine and decide if he would like to proceed with biopsy.     LOS: 2 Adelino 12/8/202212:35 PM

## 2021-09-10 LAB — CBC
HCT: 42.8 % (ref 39.0–52.0)
Hemoglobin: 14.3 g/dL (ref 13.0–17.0)
MCH: 30.8 pg (ref 26.0–34.0)
MCHC: 33.4 g/dL (ref 30.0–36.0)
MCV: 92.2 fL (ref 80.0–100.0)
Platelets: 428 10*3/uL — ABNORMAL HIGH (ref 150–400)
RBC: 4.64 MIL/uL (ref 4.22–5.81)
RDW: 12 % (ref 11.5–15.5)
WBC: 10.6 10*3/uL — ABNORMAL HIGH (ref 4.0–10.5)
nRBC: 0 % (ref 0.0–0.2)

## 2021-09-10 LAB — BASIC METABOLIC PANEL
Anion gap: 5 (ref 5–15)
BUN: 11 mg/dL (ref 6–20)
CO2: 26 mmol/L (ref 22–32)
Calcium: 8.3 mg/dL — ABNORMAL LOW (ref 8.9–10.3)
Chloride: 104 mmol/L (ref 98–111)
Creatinine, Ser: 0.66 mg/dL (ref 0.61–1.24)
GFR, Estimated: >60 mL/min (ref >60)
Glucose, Bld: 108 mg/dL — ABNORMAL HIGH (ref 70–99)
Potassium: 3.9 mmol/L (ref 3.5–5.1)
Sodium: 135 mmol/L (ref 135–145)

## 2021-09-10 MED ORDER — SULFAMETHOXAZOLE-TRIMETHOPRIM 800-160 MG PO TABS: 1.0000 | ORAL_TABLET | Freq: Every day | ORAL | 0 refills | Status: AC

## 2021-09-10 MED ORDER — PREDNISONE 20 MG PO TABS: 80.0000 mg | ORAL_TABLET | Freq: Every day | ORAL | 0 refills | Status: AC

## 2021-09-10 MED ORDER — FUROSEMIDE 40 MG PO TABS: 80.0000 mg | ORAL_TABLET | Freq: Every day | ORAL | 0 refills | Status: AC

## 2021-09-10 NOTE — Progress Notes (Signed)
[facility]  ROUNDING NOTE   Subjective:   Patient was seen by me in the office on 11/16 where it was noted that he was having more peripheral edema and home urine dipsticks with proteinuria. Found to have 3.4 grams of proteinuria. He was then asked to restart high dose prednisone at 60mg  PO daily. He was to continue his losartan and furosemide.  Patient states he has been going up on his furosemide dose. Currently at 60mg  daily. Also started on spironolactone, fluid restriction and salt restriction. However patient is now greater than 30 pounds above his original weight.   Patient asked to present to the Emergency Department after failing outpatient diuretic therapy.   Patient seen ambulating in room States he feels better Edema improved Denies shortness of breath  Urine protein/creatinine ratio 21.5   Objective:  Vital signs in last 24 hours:  Temp:  [97.5 F (36.4 C)-98.5 F (36.9 C)] 97.5 F (36.4 C) (12/09 0800) Pulse Rate:  [80-99] 80 (12/09 0800) Resp:  [16-19] 19 (12/09 0800) BP: (132-143)/(76-82) 132/76 (12/09 0800) SpO2:  [99 %-100 %] 100 % (12/09 0800) Weight:  [91.8 kg] 91.8 kg (12/08 1929)  Weight change: 5.625 kg Filed Weights   09/07/21 1700 09/08/21 1358 09/09/21 1929  Weight: 86.2 kg 86.2 kg 91.8 kg    Intake/Output: I/O last 3 completed shifts: In: 158 [P.O.:65; IV Piggyback:93] Out: 0    Intake/Output this shift:  Total I/O In: 480 [P.O.:480] Out: -   Physical Exam: General: NAD, ambulating in room  Head: Normocephalic, atraumatic. Moist oral mucosal membranes  Eyes: Anicteric  Lungs:  Clear to auscultation, normal effort  Heart: Regular rate and rhythm  Abdomen:  Soft, nontender,   Extremities:  + peripheral edema.  Neurologic: Nonfocal, moving all four extremities  Skin: No lesions       Basic Metabolic Panel: Recent Labs  Lab 09/07/21 1418 09/08/21 0712 09/09/21 0444 09/10/21 0258  NA 128* 132* 133* 135  K 3.9 3.9  3.9 3.9  CL 92* 98 102 104  CO2 31 28 27 26   GLUCOSE 95 134* 148* 108*  BUN 15 13 13 11   CREATININE 0.80 0.62 0.65 0.66  CALCIUM 7.9* 7.9* 7.9* 8.3*     Liver Function Tests: Recent Labs  Lab 09/07/21 1418 09/08/21 0645 09/09/21 0444  AST 18  --  17  ALT 15  --  14  ALKPHOS 77  --  70  BILITOT 0.6  --  0.3  PROT 4.5*  --  4.8*  ALBUMIN <1.5* 1.7* 2.4*    No results for input(s): LIPASE, AMYLASE in the last 168 hours. No results for input(s): AMMONIA in the last 168 hours.  CBC: Recent Labs  Lab 09/07/21 1418 09/08/21 0712 09/09/21 0444 09/10/21 0258  WBC 9.3 7.2 7.6 10.6*  NEUTROABS 3.8  --   --   --   HGB 17.4* 15.8 14.7 14.3  HCT 52.2* 46.5 44.0 42.8  MCV 91.6 91.5 92.6 92.2  PLT 618* 470* 464* 428*     Cardiac Enzymes: No results for input(s): CKTOTAL, CKMB, CKMBINDEX, TROPONINI in the last 168 hours.  BNP: Invalid input(s): POCBNP  CBG: No results for input(s): GLUCAP in the last 168 hours.  Microbiology: Results for orders placed or performed during the hospital encounter of 09/07/21  Resp Panel by RT-PCR (Flu A&B, Covid) Nasopharyngeal Swab     Status: None   Collection Time: 09/07/21  3:43 PM   Specimen: Nasopharyngeal Swab; Nasopharyngeal(NP) swabs in  vial transport medium  Result Value Ref Range Status   SARS Coronavirus 2 by RT PCR NEGATIVE NEGATIVE Final    Comment: (NOTE) SARS-CoV-2 target nucleic acids are NOT DETECTED.  The SARS-CoV-2 RNA is generally detectable in upper respiratory specimens during the acute phase of infection. The lowest concentration of SARS-CoV-2 viral copies this assay can detect is 138 copies/mL. A negative result does not preclude SARS-Cov-2 infection and should not be used as the sole basis for treatment or other patient management decisions. A negative result may occur with  improper specimen collection/handling, submission of specimen other than nasopharyngeal swab, presence of viral mutation(s) within  the areas targeted by this assay, and inadequate number of viral copies(<138 copies/mL). A negative result must be combined with clinical observations, patient history, and epidemiological information. The expected result is Negative.  Fact Sheet for Patients:  BloggerCourse.com  Fact Sheet for Healthcare Providers:  SeriousBroker.it  This test is no t yet approved or cleared by the [place] FDA and  has been authorized for detection and/or diagnosis of SARS-CoV-2 by FDA under an Emergency Use Authorization (EUA). This EUA will remain  in effect (meaning this test can be used) for the duration of the COVID-19 declaration under Section 564(b)(1) of the Act, 21 U.S.C.section 360bbb-3(b)(1), unless the authorization is terminated  or revoked sooner.       Influenza A by PCR NEGATIVE NEGATIVE Final   Influenza B by PCR NEGATIVE NEGATIVE Final    Comment: (NOTE) The Xpert Xpress SARS-CoV-2/FLU/RSV plus assay is intended as an aid in the diagnosis of influenza from Nasopharyngeal swab specimens and should not be used as a sole basis for treatment. Nasal washings and aspirates are unacceptable for Xpert Xpress SARS-CoV-2/FLU/RSV testing.  Fact Sheet for Patients: BloggerCourse.com  Fact Sheet for Healthcare Providers: SeriousBroker.it  This test is not yet approved or cleared by the [place] FDA and has been authorized for detection and/or diagnosis of SARS-CoV-2 by FDA under an Emergency Use Authorization (EUA). This EUA will remain in effect (meaning this test can be used) for the duration of the COVID-19 declaration under Section 564(b)(1) of the Act, 21 U.S.C. section 360bbb-3(b)(1), unless the authorization is terminated or revoked.  Performed at [facility], [address] [place], [place] [address]     Coagulation Studies: No results for input(s):  LABPROT, INR in the last 72 hours.  Urinalysis: Recent Labs    09/07/21 1543  COLORURINE YELLOW*  LABSPEC 1.017  PHURINE 6.0  GLUCOSEU NEGATIVE  HGBUR SMALL*  BILIRUBINUR NEGATIVE  KETONESUR NEGATIVE  PROTEINUR >=300*  NITRITE NEGATIVE  LEUKOCYTESUR NEGATIVE       Imaging: ECHOCARDIOGRAM COMPLETE  Result Date: 09/08/2021    ECHOCARDIOGRAM REPORT   Patient Name:   Larry Obrien Date of Exam: 09/08/2021 Medical Rec #:  [id number]        Height:       72.0 in Accession #:    [id number]       Weight:       190.0 lb Date of Birth:  12/27/1995        BSA:          2.085 m Patient Age:    25 years         BP:           140/73 mmHg Patient Gender: M                HR:  86 bpm. Exam Location:  [facility] Procedure: 2D Echo, Color Doppler and Cardiac Doppler Indications:     R94.31 Abnormal ECG  History:         Patient has no prior history of Echocardiogram examinations.                  Nephrotic syndrome; Signs/Symptoms:Edema.  Sonographer:     Charmayne Sheer Referring Phys:  [id number] [place] Diagnosing Phys: Kate Sable MD IMPRESSIONS  1. Left ventricular ejection fraction, by estimation, is 65 to 70%. The left ventricle has normal function. The left ventricle has no regional wall motion abnormalities. Left ventricular diastolic parameters were normal.  2. Right ventricular systolic function is normal. The right ventricular size is normal.  3. The mitral valve is normal in structure. No evidence of mitral valve regurgitation.  4. The aortic valve is tricuspid. Aortic valve regurgitation is not visualized.  5. The inferior vena cava is normal in size with greater than 50% respiratory variability, suggesting right atrial pressure of 3 mmHg. FINDINGS  Left Ventricle: Left ventricular ejection fraction, by estimation, is 65 to 70%. The left ventricle has normal function. The left ventricle has no regional wall motion abnormalities. The left ventricular internal cavity size was normal in size.  There is  no left ventricular hypertrophy. Left ventricular diastolic parameters were normal. Right Ventricle: The right ventricular size is normal. No increase in right ventricular wall thickness. Right ventricular systolic function is normal. Left Atrium: Left atrial size was normal in size. Right Atrium: Right atrial size was normal in size. Pericardium: There is no evidence of pericardial effusion. Mitral Valve: The mitral valve is normal in structure. No evidence of mitral valve regurgitation. MV peak gradient, 7.2 mmHg. The mean mitral valve gradient is 4.0 mmHg. Tricuspid Valve: The tricuspid valve is normal in structure. Tricuspid valve regurgitation is not demonstrated. Aortic Valve: The aortic valve is tricuspid. Aortic valve regurgitation is not visualized. Aortic valve mean gradient measures 5.0 mmHg. Aortic valve peak gradient measures 9.4 mmHg. Aortic valve area, by VTI measures 2.79 cm. Pulmonic Valve: The pulmonic valve was normal in structure. Pulmonic valve regurgitation is not visualized. Aorta: The aortic root is normal in size and structure. Venous: The inferior vena cava is normal in size with greater than 50% respiratory variability, suggesting right atrial pressure of 3 mmHg. IAS/Shunts: No atrial level shunt detected by color flow Doppler.  LEFT VENTRICLE PLAX 2D LVIDd:         4.47 cm   Diastology LVIDs:         2.66 cm   LV e' medial:    10.10 cm/s LV PW:         0.98 cm   LV E/e' medial:  9.8 LV IVS:        0.89 cm   LV e' lateral:   15.00 cm/s LVOT diam:     2.10 cm   LV E/e' lateral: 6.6 LV SV:         77 LV SV Index:   37 LVOT Area:     3.46 cm  RIGHT VENTRICLE RV Basal diam:  3.15 cm LEFT ATRIUM           Index        RIGHT ATRIUM          Index LA diam:      3.20 cm 1.53 cm/m   RA Area:     7.51 cm LA Vol (A2C): 17.9 ml 8.59 ml/m  RA Volume:   11.70 ml 5.61 ml/m LA Vol (A4C): 37.2 ml 17.84 ml/m  AORTIC VALVE                     PULMONIC VALVE AV Area (Vmax):    2.81 cm       PV Vmax:       1.25 m/s AV Area (Vmean):   2.92 cm      PV Vmean:      89.500 cm/s AV Area (VTI):     2.79 cm      PV VTI:        0.226 m AV Vmax:           153.00 cm/s   PV Peak grad:  6.2 mmHg AV Vmean:          107.000 cm/s  PV Mean grad:  4.0 mmHg AV VTI:            0.277 m AV Peak Grad:      9.4 mmHg AV Mean Grad:      5.0 mmHg LVOT Vmax:         124.00 cm/s LVOT Vmean:        90.300 cm/s LVOT VTI:          0.223 m LVOT/AV VTI ratio: 0.81  AORTA Ao Root diam: 2.80 cm MITRAL VALVE MV Area (PHT): 5.66 cm    SHUNTS MV Area VTI:   2.74 cm    Systemic VTI:  0.22 m MV Peak grad:  7.2 mmHg    Systemic Diam: 2.10 cm MV Mean grad:  4.0 mmHg MV Vmax:       1.34 m/s MV Vmean:      96.6 cm/s MV Decel Time: 134 msec MV E velocity: 99.40 cm/s MV A velocity: 95.50 cm/s MV E/A ratio:  1.04 Kate Sable MD Electronically signed by Kate Sable MD Signature Date/Time: 09/08/2021/4:50:44 PM    Final      Medications:    albumin human 25 g (09/10/21 0631)    enoxaparin (LOVENOX) injection  40 mg Subcutaneous Q24H   furosemide  60 mg Intravenous Q12H   losartan  25 mg Oral Daily   predniSONE  80 mg Oral Q breakfast   spironolactone  25 mg Oral Daily   sulfamethoxazole-trimethoprim  1 tablet Oral Daily   acetaminophen **OR** acetaminophen, albuterol, hydrALAZINE, zolpidem  Assessment/ Plan:  Mr. Larry Obrien is a 25 y.o. 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 Seems to have initially achieved remission with high dose steroids. However back with relapse despite high dose steroids, losartan, and loop diuretics.   IgA nephropathy with nephrotic syndrome Proteinuria  Peripheral edema/anasarca Hypertension Hypoalbuminuria.   Failed outpatient therapy with PO furosemide, spironolactone, losartan and PO prednisone. Bilateral lower extremity dopplers negative for DVT.   -Edema improved with albumin and IV furosemide - Continues to have a large amount of protein in urine -  Patient cleared to discharge from renal stance. Follow up appt scheduled with our office - recommend discharge with Prednisone 80 mg and low dose Losartan. Will also discharge with Furosemide 80mg  daily.     LOS: 3 Larry Obrien 12/9/202210:54 AM

## 2021-09-10 NOTE — Progress Notes (Signed)
Chaplain Maggie made initial visit at bedside. Pt eager for discharge. Prayer and conversation were meaningful in this visit. No follow up as pt discharging today.

## 2021-09-10 NOTE — Discharge Summary (Signed)
Physician Discharge Summary  Larry Obrien [id number] DOB: [birth year] DOA: 09/07/2021  PCP: Rusty Aus, MD  Admit date: 09/07/2021 Discharge date: 09/10/2021  Admitted From: Home Discharge disposition: Home   Code Status: Full Code   Discharge Diagnosis:   Principal Problem:   Nephrotic syndrome Active Problems:   IgA nephropathy   Proteinuria    Chief Complaint  Patient presents with   Leg Swelling    Brief narrative: [name] is a 25 y.o. male with PMH significant for nephrotic syndrome currently on steroids, follows up with nephrologist Dr. Juleen China as an outpatient. Patient has nephrotic syndrome since childhood with intermittent flareups.  At 1 point, he was diagnosed as IgA nephropathy. Last flareup was few months ago which responded to high-dose of steroids. 11/16 -seen by nephrologist in the office for peripheral edema and home urine dipsticks with proteinuria.  3.4 g of proteinuria was noted.  He was restarted on prednisone 60 mg daily and continued on losartan and Lasix.  Over the course of next few days, Lasix dose was increased, Aldactone was also added.  He was also placed on fluid restriction and salt restriction.  However he is fluid retention and continued to worsen and he became 30 pound over his original weight. On 12/6, he was sent to the ED for inpatient admission and management.   Initial lab in the ED showed normal creatinine, urine protein more than 300. Admitted to hospitalist service Nephrology consultation was obtained.  Patient was started on IV steroids, IV Lasix, IV albumin.  Subjective: Patient was seen and examined this morning.  Lying down in bed.  Not in distress.  No new symptoms.  Ongoing diuresis with IV Lasix. Nephrology follow-up appreciated.  Recommended discharge to follow-up as an outpatient.  Hospital course: Acute flareup of nephrotic syndrome Previous biopsy showed IgA nephropathy. -As an inpatient, he was  treated with IV Solu-Medrol 125 mg daily for 3 days, IV Lasix 60 mg twice daily  -Protein creatinine ratio was noted to be elevated to 11.83 and on repeat it is further elevated to 21.5. -Per nephrology follow-up note, patient can be discharged home on prednisone 80 mg daily, Lasix 80 mg daily -Also to continue losartan and Aldactone, fluid restriction. -Nephrology started him on Bactrim prophylaxis as well.   Hypoalbuminemia -Albumin level lower than 1.5, likely due to proteinuria as part of nephrotic syndrome itself. -IV albumin given by nephrology.   Mobility: Encourage ambulation Living condition: Lives at home Goals of care:   Code Status: Full Code  Nutritional status: Body mass index is 27.45 kg/m.       Discharge Medications:   Allergies as of 09/10/2021   No Known Allergies      Medication List     STOP taking these medications    buprenorphine-naloxone 8-2 mg Subl SL tablet Commonly known as: SUBOXONE       TAKE these medications    furosemide 40 MG tablet Commonly known as: Lasix Take 2 tablets (80 mg total) by mouth daily. What changed:  how much to take when to take this additional instructions Another medication with the same name was removed. Continue taking this medication, and follow the directions you see here.   losartan 25 MG tablet Commonly known as: COZAAR Take 25 mg by mouth daily.   predniSONE 20 MG tablet Commonly known as: DELTASONE Take 4 tablets (80 mg total) by mouth daily with breakfast. Start taking on: September 11, 2021 What changed:  medication  strength how much to take when to take this   spironolactone 25 MG tablet Commonly known as: ALDACTONE Take 25 mg by mouth daily.   sulfamethoxazole-trimethoprim 800-160 MG tablet Commonly known as: BACTRIM DS Take 1 tablet by mouth daily. Start taking on: September 11, 2021        Wound care:     Discharge Instructions:   Discharge Instructions     Call MD for:   difficulty breathing, headache or visual disturbances   Complete by: As directed    Call MD for:  extreme fatigue   Complete by: As directed    Call MD for:  hives   Complete by: As directed    Call MD for:  persistant dizziness or light-headedness   Complete by: As directed    Call MD for:  persistant nausea and vomiting   Complete by: As directed    Call MD for:  severe uncontrolled pain   Complete by: As directed    Call MD for:  temperature >100.4   Complete by: As directed    Diet general   Complete by: As directed    Discharge instructions   Complete by: As directed    General discharge instructions:  Follow with Primary MD Rusty Aus, MD in 7 days   Get CBC/BMP checked in next visit within 1 week by PCP or SNF MD. (We routinely change or add medications that can affect your baseline labs and fluid status, therefore we recommend that you get the mentioned basic workup next visit with your PCP, your PCP may decide not to get them or add new tests based on their clinical decision)  On your next visit with your PCP, please get your medicines reviewed and adjusted.  Please request your PCP  to go over all hospital tests, procedures, radiology results at the follow up, please get all Hospital records sent to your PCP by signing hospital release before you go home.  Activity: As tolerated with Full fall precautions use walker/cane & assistance as needed  Avoid using any recreational substances like cigarette, tobacco, alcohol, or non-prescribed drug.  If you experience worsening of your admission symptoms, develop shortness of breath, life threatening emergency, suicidal or homicidal thoughts you must seek medical attention immediately by calling 911 or calling your MD immediately  if symptoms less severe.  You must read complete instructions/literature along with all the possible adverse reactions/side effects for all the medicines you take and that have been prescribed to  you. Take any new medicine only after you have completely understood and accepted all the possible adverse reactions/side effects.   Do not drive, operate heavy machinery, perform activities at heights, swimming or participation in water activities or provide baby sitting services if your were admitted for syncope or siezures until you have seen by Primary MD or a Neurologist and advised to do so again.  Do not drive when taking Pain medications.  Do not take more than prescribed Pain, Sleep and Anxiety Medications  Wear Seat belts while driving.  Please note You were cared for by a hospitalist during your hospital stay. If you have any questions about your discharge medications or the care you received while you were in the hospital after you are discharged, you can call the unit and asked to speak with the hospitalist on call if the hospitalist that took care of you is not available. Once you are discharged, your primary care physician will handle any further medical issues. Please note  that NO REFILLS for any discharge medications will be authorized once you are discharged, as it is imperative that you return to your primary care physician (or establish a relationship with a primary care physician if you do not have one) for your aftercare needs so that they can reassess your need for medications and monitor your lab values.   Increase activity slowly   Complete by: As directed        Follow ups:    Follow-up Information     Rusty Aus, MD Follow up.   Specialty: Internal Medicine Contact information: [place] [address] [phone]         Lavonia Dana, MD Follow up.   Specialty: Nephrology Contact information: [place] [address] [phone]                 Discharge Exam:   Vitals:   09/09/21 1923 09/09/21 1929 09/10/21 0448 09/10/21 0800  BP: 134/76  (!)  143/82 132/76  Pulse: 99  88 80  Resp: 17  16 19   Temp: 97.8 F (36.6 C)  98.5 F (36.9 C) (!) 97.5 F (36.4 C)  TempSrc:   Oral Oral  SpO2: 99%  99% 100%  Weight:  91.8 kg    Height:        Body mass index is 27.45 kg/m.  General exam: Pleasant, young Caucasian male.  Not in physical distress Skin: No rashes, lesions or ulcers. HEENT: Atraumatic, normocephalic, no obvious bleeding Lungs: Clear to auscultation bilaterally CVS: Regular rate and rhythm, no murmur GI/Abd soft, nontender, nondistended, bowel sound present CNS: Alert, awake, oriented x3 Psychiatry: Mood appropriate Extremities: Pedal edema 1+ bilaterally, no calf tenderness  Time coordinating discharge: 35 minutes   The results of significant diagnostics from this hospitalization (including imaging, microbiology, ancillary and laboratory) are listed below for reference.    Procedures and Diagnostic Studies:   ECHOCARDIOGRAM COMPLETE  Result Date: 09/08/2021    ECHOCARDIOGRAM REPORT   Patient Name:   Larry Obrien Date of Exam: 09/08/2021 Medical Rec #:  [id number]        Height:       72.0 in Accession #:    [id number]       Weight:       190.0 lb Date of Birth:  1996-04-26        BSA:          2.085 m Patient Age:    25 years         BP:           140/73 mmHg Patient Gender: M                HR:           86 bpm. Exam Location:  [facility] Procedure: 2D Echo, Color Doppler and Cardiac Doppler Indications:     R94.31 Abnormal ECG  History:         Patient has no prior history of Echocardiogram examinations.                  Nephrotic syndrome; Signs/Symptoms:Edema.  Sonographer:     Charmayne Sheer Referring Phys:  [id number] [place] Diagnosing Phys: Kate Sable MD IMPRESSIONS  1. Left ventricular ejection fraction, by estimation, is 65 to 70%. The left ventricle has normal function. The left ventricle has no regional wall motion abnormalities. Left ventricular diastolic parameters were normal.  2.  Right ventricular  systolic function is normal. The right ventricular size is normal.  3. The mitral valve is normal in structure. No evidence of mitral valve regurgitation.  4. The aortic valve is tricuspid. Aortic valve regurgitation is not visualized.  5. The inferior vena cava is normal in size with greater than 50% respiratory variability, suggesting right atrial pressure of 3 mmHg. FINDINGS  Left Ventricle: Left ventricular ejection fraction, by estimation, is 65 to 70%. The left ventricle has normal function. The left ventricle has no regional wall motion abnormalities. The left ventricular internal cavity size was normal in size. There is  no left ventricular hypertrophy. Left ventricular diastolic parameters were normal. Right Ventricle: The right ventricular size is normal. No increase in right ventricular wall thickness. Right ventricular systolic function is normal. Left Atrium: Left atrial size was normal in size. Right Atrium: Right atrial size was normal in size. Pericardium: There is no evidence of pericardial effusion. Mitral Valve: The mitral valve is normal in structure. No evidence of mitral valve regurgitation. MV peak gradient, 7.2 mmHg. The mean mitral valve gradient is 4.0 mmHg. Tricuspid Valve: The tricuspid valve is normal in structure. Tricuspid valve regurgitation is not demonstrated. Aortic Valve: The aortic valve is tricuspid. Aortic valve regurgitation is not visualized. Aortic valve mean gradient measures 5.0 mmHg. Aortic valve peak gradient measures 9.4 mmHg. Aortic valve area, by VTI measures 2.79 cm. Pulmonic Valve: The pulmonic valve was normal in structure. Pulmonic valve regurgitation is not visualized. Aorta: The aortic root is normal in size and structure. Venous: The inferior vena cava is normal in size with greater than 50% respiratory variability, suggesting right atrial pressure of 3 mmHg. IAS/Shunts: No atrial level shunt detected by color flow Doppler.  LEFT VENTRICLE PLAX 2D LVIDd:          4.47 cm   Diastology LVIDs:         2.66 cm   LV e' medial:    10.10 cm/s LV PW:         0.98 cm   LV E/e' medial:  9.8 LV IVS:        0.89 cm   LV e' lateral:   15.00 cm/s LVOT diam:     2.10 cm   LV E/e' lateral: 6.6 LV SV:         77 LV SV Index:   37 LVOT Area:     3.46 cm  RIGHT VENTRICLE RV Basal diam:  3.15 cm LEFT ATRIUM           Index        RIGHT ATRIUM          Index LA diam:      3.20 cm 1.53 cm/m   RA Area:     7.51 cm LA Vol (A2C): 17.9 ml 8.59 ml/m   RA Volume:   11.70 ml 5.61 ml/m LA Vol (A4C): 37.2 ml 17.84 ml/m  AORTIC VALVE                     PULMONIC VALVE AV Area (Vmax):    2.81 cm      PV Vmax:       1.25 m/s AV Area (Vmean):   2.92 cm      PV Vmean:      89.500 cm/s AV Area (VTI):     2.79 cm      PV VTI:        0.226 m AV Vmax:  153.00 cm/s   PV Peak grad:  6.2 mmHg AV Vmean:          107.000 cm/s  PV Mean grad:  4.0 mmHg AV VTI:            0.277 m AV Peak Grad:      9.4 mmHg AV Mean Grad:      5.0 mmHg LVOT Vmax:         124.00 cm/s LVOT Vmean:        90.300 cm/s LVOT VTI:          0.223 m LVOT/AV VTI ratio: 0.81  AORTA Ao Root diam: 2.80 cm MITRAL VALVE MV Area (PHT): 5.66 cm    SHUNTS MV Area VTI:   2.74 cm    Systemic VTI:  0.22 m MV Peak grad:  7.2 mmHg    Systemic Diam: 2.10 cm MV Mean grad:  4.0 mmHg MV Vmax:       1.34 m/s MV Vmean:      96.6 cm/s MV Decel Time: 134 msec MV E velocity: 99.40 cm/s MV A velocity: 95.50 cm/s MV E/A ratio:  1.04 Kate Sable MD Electronically signed by Kate Sable MD Signature Date/Time: 09/08/2021/4:50:44 PM    Final      Labs:   Basic Metabolic Panel: Recent Labs  Lab 09/07/21 1418 09/08/21 0712 09/09/21 0444 09/10/21 0258  NA 128* 132* 133* 135  K 3.9 3.9 3.9 3.9  CL 92* 98 102 104  CO2 31 28 27 26   GLUCOSE 95 134* 148* 108*  BUN 15 13 13 11   CREATININE 0.80 0.62 0.65 0.66  CALCIUM 7.9* 7.9* 7.9* 8.3*   GFR Estimated Creatinine Clearance: 153.6 mL/min (by C-G formula based on SCr of 0.66  mg/dL). Liver Function Tests: Recent Labs  Lab 09/07/21 1418 09/08/21 0645 09/09/21 0444  AST 18  --  17  ALT 15  --  14  ALKPHOS 77  --  70  BILITOT 0.6  --  0.3  PROT 4.5*  --  4.8*  ALBUMIN <1.5* 1.7* 2.4*   No results for input(s): LIPASE, AMYLASE in the last 168 hours. No results for input(s): AMMONIA in the last 168 hours. Coagulation profile No results for input(s): INR, PROTIME in the last 168 hours.  CBC: Recent Labs  Lab 09/07/21 1418 09/08/21 0712 09/09/21 0444 09/10/21 0258  WBC 9.3 7.2 7.6 10.6*  NEUTROABS 3.8  --   --   --   HGB 17.4* 15.8 14.7 14.3  HCT 52.2* 46.5 44.0 42.8  MCV 91.6 91.5 92.6 92.2  PLT 618* 470* 464* 428*   Cardiac Enzymes: No results for input(s): CKTOTAL, CKMB, CKMBINDEX, TROPONINI in the last 168 hours. BNP: Invalid input(s): POCBNP CBG: No results for input(s): GLUCAP in the last 168 hours. D-Dimer No results for input(s): DDIMER in the last 72 hours. Hgb A1c No results for input(s): HGBA1C in the last 72 hours. Lipid Profile No results for input(s): CHOL, HDL, LDLCALC, TRIG, CHOLHDL, LDLDIRECT in the last 72 hours. Thyroid function studies No results for input(s): TSH, T4TOTAL, T3FREE, THYROIDAB in the last 72 hours.  Invalid input(s): FREET3 Anemia work up No results for input(s): VITAMINB12, FOLATE, FERRITIN, TIBC, IRON, RETICCTPCT in the last 72 hours. Microbiology Recent Results (from the past 240 hour(s))  Resp Panel by RT-PCR (Flu A&B, Covid) Nasopharyngeal Swab     Status: None   Collection Time: 09/07/21  3:43 PM   Specimen: Nasopharyngeal Swab; Nasopharyngeal(NP) swabs in vial transport medium  Result Value Ref Range Status   SARS Coronavirus 2 by RT PCR NEGATIVE NEGATIVE Final    Comment: (NOTE) SARS-CoV-2 target nucleic acids are NOT DETECTED.  The SARS-CoV-2 RNA is generally detectable in upper respiratory specimens during the acute phase of infection. The lowest concentration of SARS-CoV-2 viral copies  this assay can detect is 138 copies/mL. A negative result does not preclude SARS-Cov-2 infection and should not be used as the sole basis for treatment or other patient management decisions. A negative result may occur with  improper specimen collection/handling, submission of specimen other than nasopharyngeal swab, presence of viral mutation(s) within the areas targeted by this assay, and inadequate number of viral copies(<138 copies/mL). A negative result must be combined with clinical observations, patient history, and epidemiological information. The expected result is Negative.  Fact Sheet for Patients:  BloggerCourse.com  Fact Sheet for Healthcare Providers:  SeriousBroker.it  This test is no t yet approved or cleared by the [place] FDA and  has been authorized for detection and/or diagnosis of SARS-CoV-2 by FDA under an Emergency Use Authorization (EUA). This EUA will remain  in effect (meaning this test can be used) for the duration of the COVID-19 declaration under Section 564(b)(1) of the Act, 21 U.S.C.section 360bbb-3(b)(1), unless the authorization is terminated  or revoked sooner.       Influenza A by PCR NEGATIVE NEGATIVE Final   Influenza B by PCR NEGATIVE NEGATIVE Final    Comment: (NOTE) The Xpert Xpress SARS-CoV-2/FLU/RSV plus assay is intended as an aid in the diagnosis of influenza from Nasopharyngeal swab specimens and should not be used as a sole basis for treatment. Nasal washings and aspirates are unacceptable for Xpert Xpress SARS-CoV-2/FLU/RSV testing.  Fact Sheet for Patients: BloggerCourse.com  Fact Sheet for Healthcare Providers: SeriousBroker.it  This test is not yet approved or cleared by the [place] FDA and has been authorized for detection and/or diagnosis of SARS-CoV-2 by FDA under an Emergency Use Authorization (EUA). This EUA  will remain in effect (meaning this test can be used) for the duration of the COVID-19 declaration under Section 564(b)(1) of the Act, 21 U.S.C. section 360bbb-3(b)(1), unless the authorization is terminated or revoked.  Performed at [facility], [address], [place], [place] [address]      Signed: Lorin Glass  [facility] 09/10/2021, 1:23 PM

## 2022-01-28 IMAGING — US US EXTREM LOW VENOUS
1 series · 14 of 24 positions shown · non-contrast
Comparison: None.

CLINICAL DATA: Bilateral lower extremity swelling

EXAM:
Bilateral LOWER EXTREMITY VENOUS DOPPLER ULTRASOUND
TECHNIQUE: Gray-scale sonography with compression, as well as color and duplex
ultrasound, were performed to evaluate the deep venous system(s)
from the level of the common femoral vein through the popliteal and
proximal calf veins.

[Series 1: us venous img lower bilat (dvt) · portal-venous · 14 of 56 slices shown]
[im 1/56]
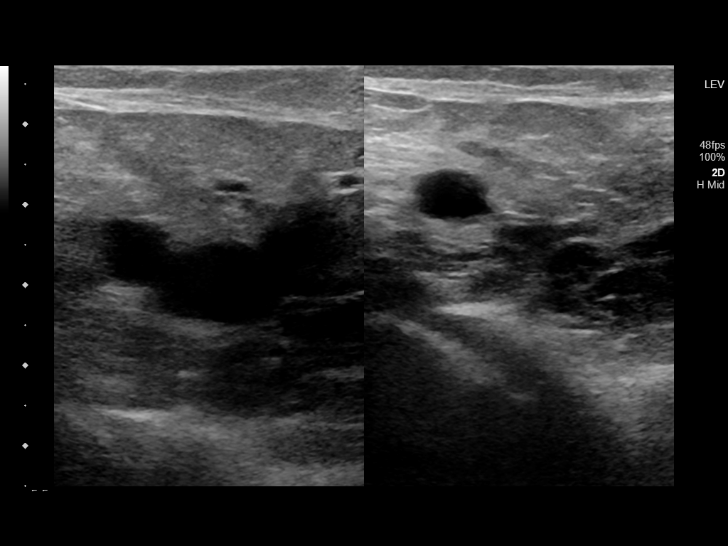
[im 5/56]
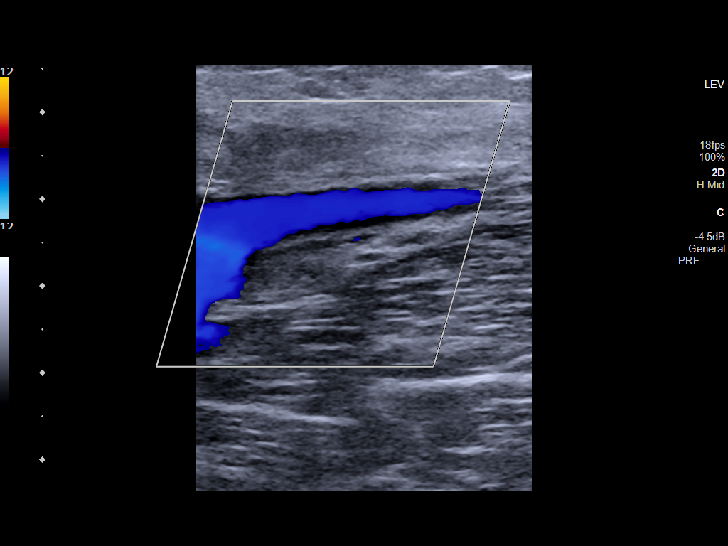
[im 10/56]
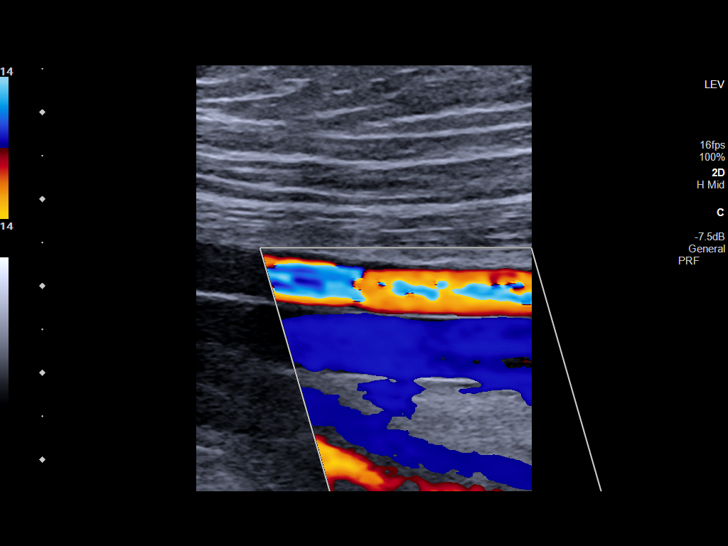
[im 15/56]
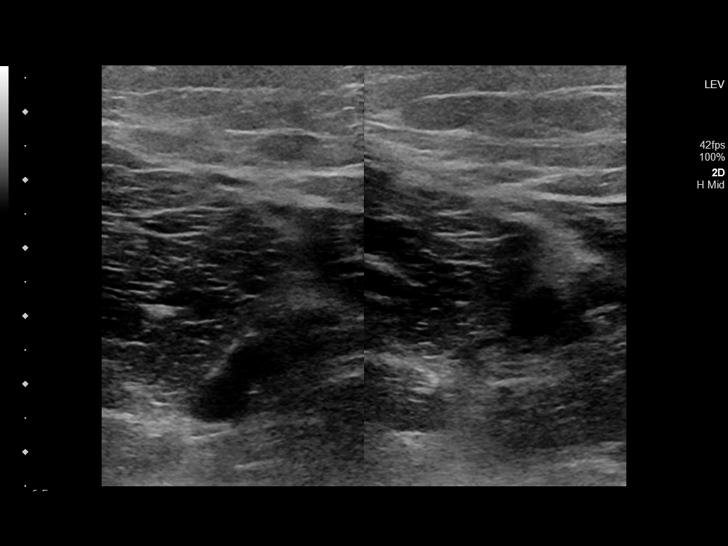
[im 17/56]
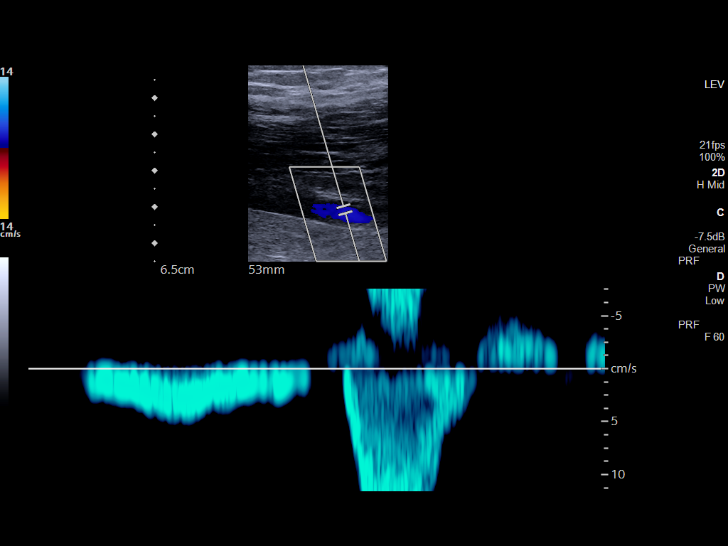
[im 22/56]
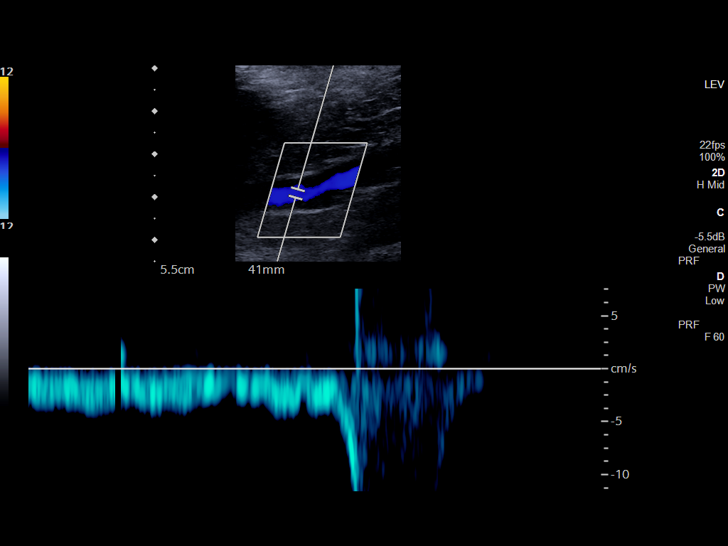
[im 27/56]
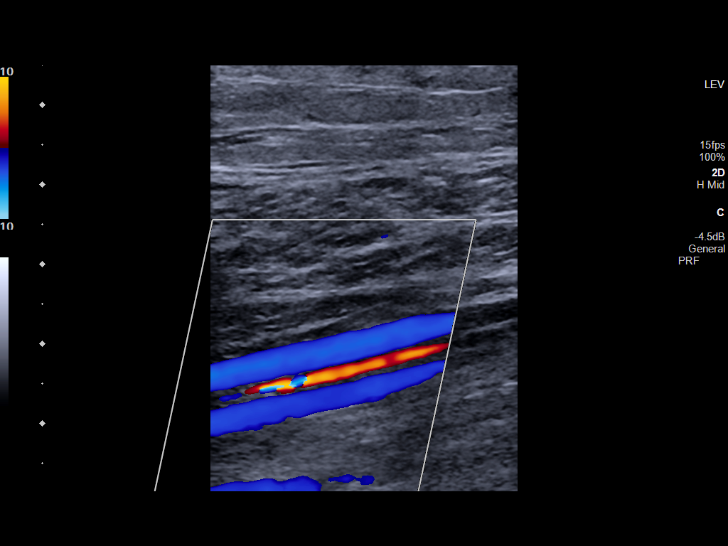
[im 29/56]
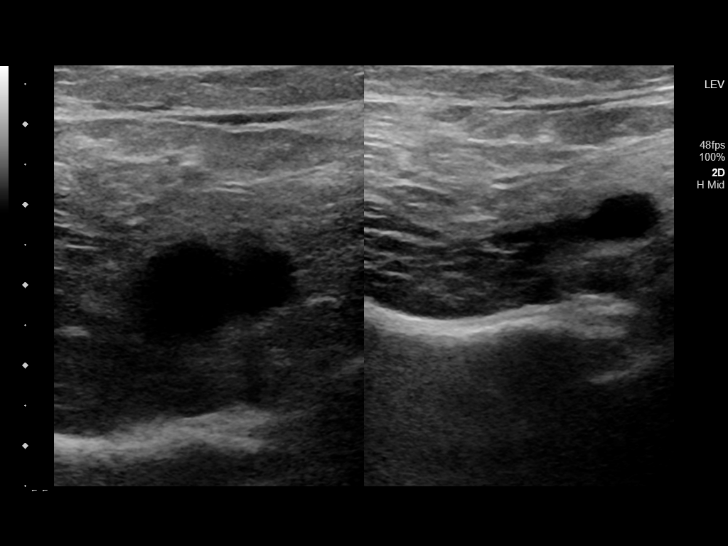
[im 34/56]
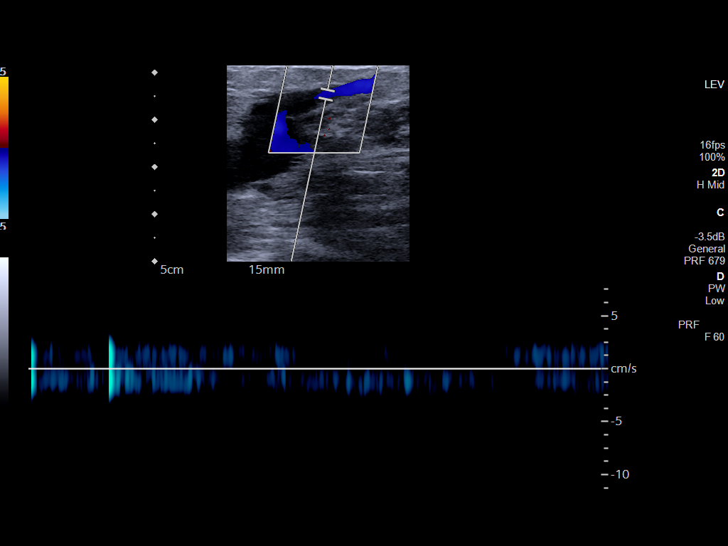
[im 39/56]
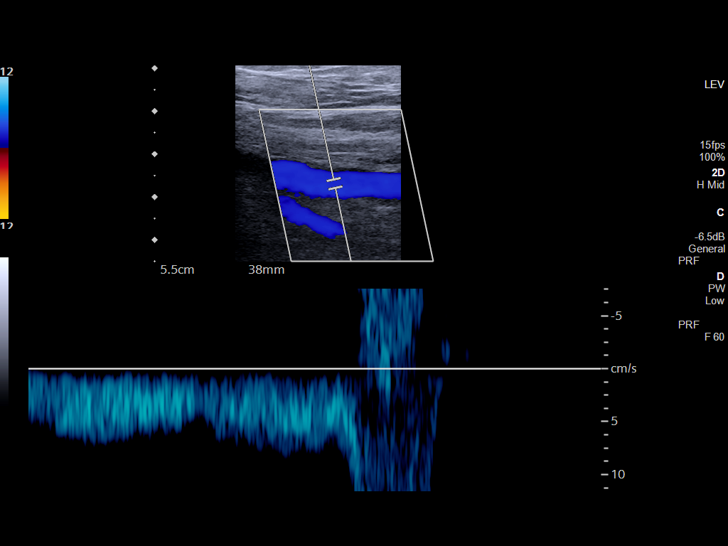
[im 44/56]
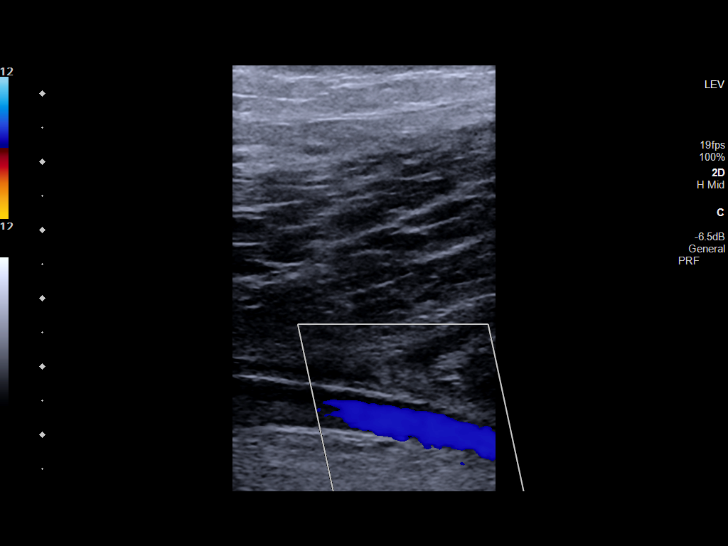
[im 46/56]
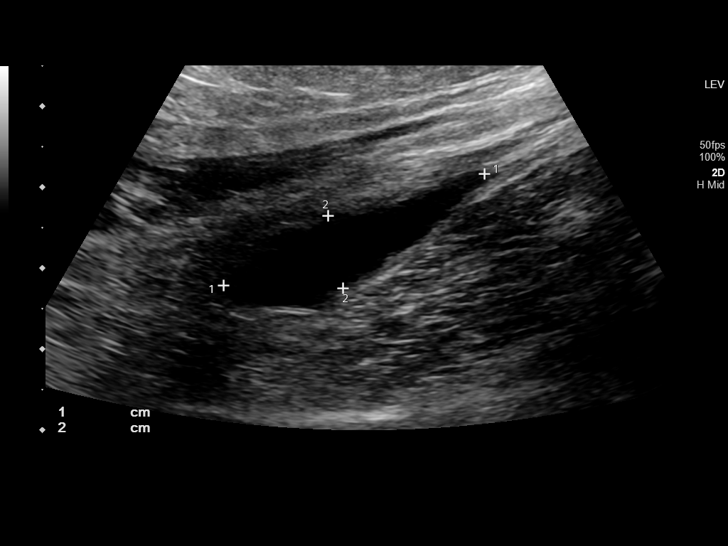
[im 51/56]
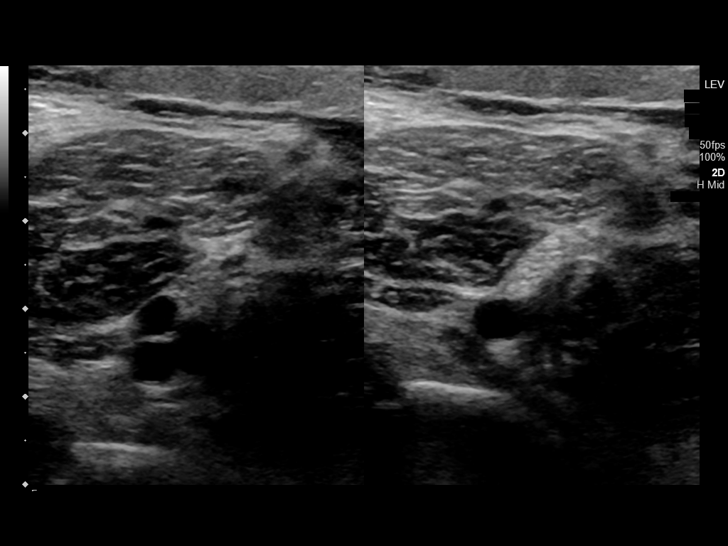
[im 56/56]
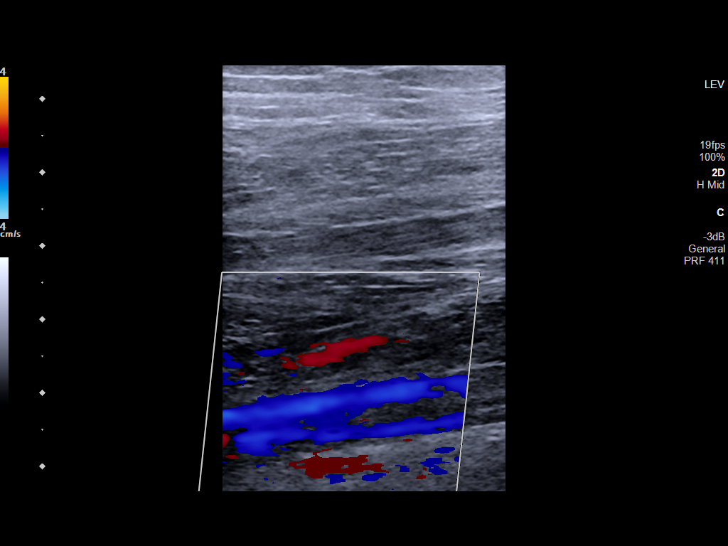

[14 of 24 positions shown; findings below may reference images not displayed]

FINDINGS: VENOUS

Normal compressibility of the common femoral, superficial femoral,
and popliteal veins, as well as the visualized calf veins.
Visualized portions of profunda femoral vein and great saphenous
vein unremarkable. No filling defects to suggest DVT on grayscale or
color Doppler imaging. Doppler waveforms show normal direction of
venous flow, normal respiratory plasticity and response to
augmentation.

Limited views of the contralateral common femoral vein are
unremarkable.

OTHER

Fluid collections in the popliteal fossa is, on the right measuring
5.5 x 0.7 x 2 cm and on the left measuring 3.5 x 0.9 x 1.9 cm.

Limitations: none
IMPRESSION: 1. Negative for acute bilateral lower extremity DVT.
2. Bilateral popliteal fossa cysts.

## 2022-01-28 IMAGING — DX DG CHEST 1V PORT
1 series · 1 of 1 positions shown · non-contrast
Comparison: None.

CLINICAL DATA: Shortness of breath.

EXAM:
PORTABLE CHEST 1 VIEW

[chest ap]
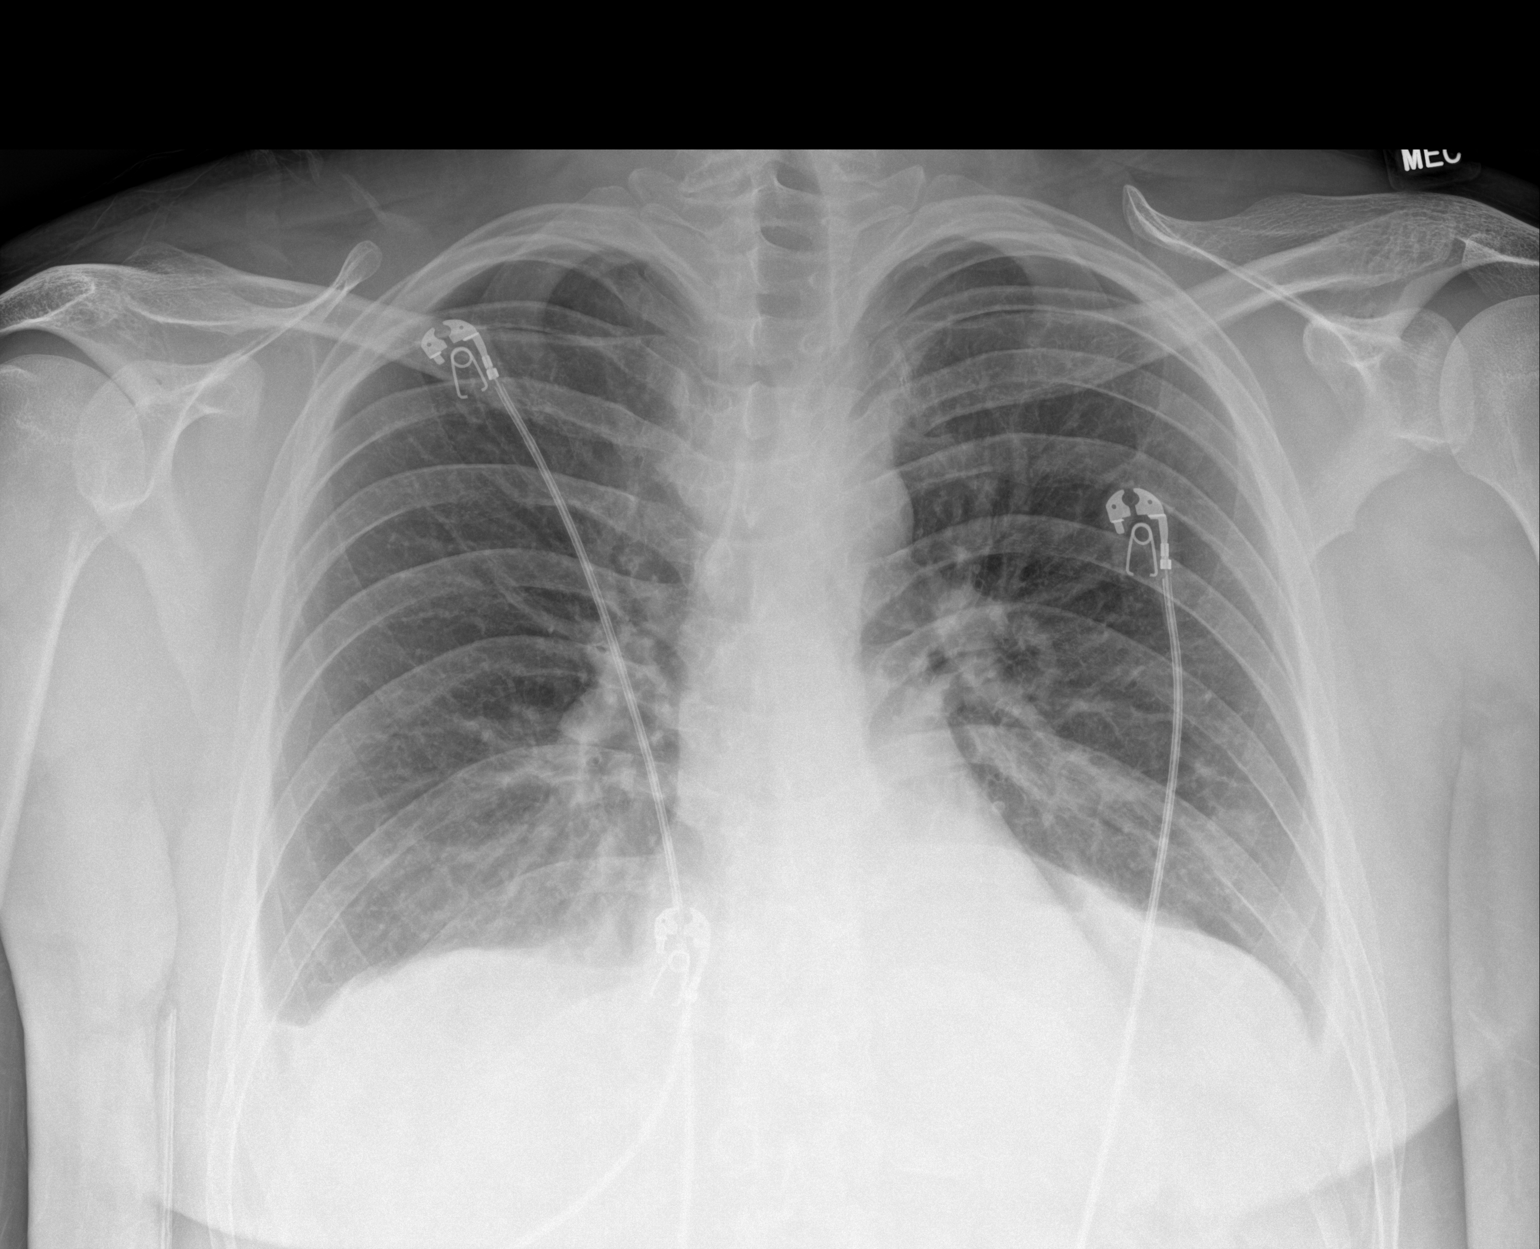

[1 of 1 positions shown; findings below may reference images not displayed]

FINDINGS: The heart size and mediastinal contours are within normal limits.
Mild bibasilar subsegmental atelectasis is noted with small
bilateral pleural effusions. The visualized skeletal structures are
unremarkable.
IMPRESSION: Mild bibasilar subsegmental atelectasis with small bilateral pleural
effusions.
